# Patient Record
Sex: Female | Born: 1965 | Race: White | Hispanic: No | Marital: Single | Smoking: Never smoker
Health system: Southern US, Community
[De-identification: ages and names within clinical notes are randomized; demographics above are authoritative.]

## PROBLEM LIST (undated history)

## (undated) DIAGNOSIS — I890 Lymphedema, not elsewhere classified: Secondary | ICD-10-CM

## (undated) DIAGNOSIS — M1990 Unspecified osteoarthritis, unspecified site: Secondary | ICD-10-CM

## (undated) DIAGNOSIS — Z800 Family history of malignant neoplasm of digestive organs: Secondary | ICD-10-CM

## (undated) DIAGNOSIS — C50512 Malignant neoplasm of lower-outer quadrant of left female breast: Secondary | ICD-10-CM

## (undated) DIAGNOSIS — Z9889 Other specified postprocedural states: Secondary | ICD-10-CM

## (undated) DIAGNOSIS — I10 Essential (primary) hypertension: Secondary | ICD-10-CM

## (undated) DIAGNOSIS — R112 Nausea with vomiting, unspecified: Secondary | ICD-10-CM

## (undated) DIAGNOSIS — I739 Peripheral vascular disease, unspecified: Secondary | ICD-10-CM

## (undated) HISTORY — DX: Family history of malignant neoplasm of digestive organs: Z80.0

## (undated) HISTORY — PX: KNEE SURGERY: SHX244

## (undated) HISTORY — PX: CHOLECYSTECTOMY: SHX55

---
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
## 2001-02-02 ENCOUNTER — Encounter: Admission: RE | Admit: 2001-02-02 | Discharge: 2001-05-03 | Payer: Self-pay | Admitting: *Deleted

## 2004-11-06 ENCOUNTER — Ambulatory Visit: Payer: Self-pay | Admitting: Family Medicine

## 2005-05-28 ENCOUNTER — Ambulatory Visit: Payer: Self-pay | Admitting: Family Medicine

## 2005-08-06 ENCOUNTER — Other Ambulatory Visit: Admission: RE | Admit: 2005-08-06 | Discharge: 2005-08-06 | Payer: Self-pay | Admitting: Obstetrics and Gynecology

## 2005-09-19 ENCOUNTER — Ambulatory Visit (HOSPITAL_COMMUNITY): Admission: RE | Admit: 2005-09-19 | Discharge: 2005-09-19 | Payer: Self-pay | Admitting: Obstetrics and Gynecology

## 2005-12-22 ENCOUNTER — Ambulatory Visit: Payer: Self-pay | Admitting: Family Medicine

## 2006-01-21 ENCOUNTER — Ambulatory Visit: Payer: Self-pay | Admitting: Family Medicine

## 2012-07-22 ENCOUNTER — Emergency Department (HOSPITAL_BASED_OUTPATIENT_CLINIC_OR_DEPARTMENT_OTHER)
Admission: EM | Admit: 2012-07-22 | Discharge: 2012-07-22 | Disposition: A | Discharge status: Home / Self Care | Payer: BC Managed Care – PPO | Attending: Emergency Medicine | Admitting: Emergency Medicine

## 2012-07-22 ENCOUNTER — Encounter (HOSPITAL_BASED_OUTPATIENT_CLINIC_OR_DEPARTMENT_OTHER): Payer: Self-pay | Admitting: *Deleted

## 2012-07-22 DIAGNOSIS — N644 Mastodynia: Secondary | ICD-10-CM | POA: Insufficient documentation

## 2012-07-22 DIAGNOSIS — I10 Essential (primary) hypertension: Secondary | ICD-10-CM | POA: Insufficient documentation

## 2012-07-22 DIAGNOSIS — N63 Unspecified lump in unspecified breast: Secondary | ICD-10-CM

## 2012-07-22 HISTORY — DX: Lymphedema, not elsewhere classified: I89.0

## 2012-07-22 HISTORY — DX: Essential (primary) hypertension: I10

## 2012-07-22 NOTE — ED Provider Notes (Signed)
History     CSN: [id number]  Arrival date & time 07/22/12  1049   First MD Initiated Contact with Patient 07/22/12 1127      Chief Complaint  Patient presents with  . Breast Pain    (Consider location/radiation/quality/duration/timing/severity/associated sxs/prior treatment) HPI Comments: Patient presents with 6 week history of discomfort in the left breast.  She denies any injury or trauma.  No nipple discharge.  No redness.  No fevers or chills.    The history is provided by the patient.    Past Medical History  Diagnosis Date  . Lymph edema   . Hypertension     Past Surgical History  Procedure Date  . Cholecystectomy   . Knee surgery     No family history on file.  History  Substance Use Topics  . Smoking status: Never Smoker   . Smokeless tobacco: Not on file  . Alcohol Use: No    OB History    Grav Para Term Preterm Abortions TAB SAB Ect Mult Living                  Review of Systems  All other systems reviewed and are negative.    Allergies  Review of patient's allergies indicates no known allergies.  Home Medications   Current Outpatient Rx  Name Route Sig Dispense Refill  . LISINOPRIL PO Oral Take by mouth.      BP 157/77  Pulse 82  Temp 98.6 F (37 C) (Oral)  Resp 18  Ht 5\' 6"  (1.676 m)  Wt 340 lb (154.223 kg)  BMI 54.88 kg/m2  SpO2 100%  Physical Exam  Nursing note and vitals reviewed. Constitutional: She is oriented to person, place, and time. She appears well-developed and well-nourished.  HENT:  Head: Normocephalic and atraumatic.  Neck: Normal range of motion. Neck supple.  Neurological: She is alert and oriented to person, place, and time.  Skin: Skin is warm and dry.       Breast exam was performed which reveals a 2 cm mobile, firm area to the tissue inferior to the axilla and lateral to the left breast.  There is no nipple redness or discharge.      ED Course  Procedures (including critical care time)  Labs  Reviewed - No data to display No results found.   No diagnosis found.    MDM  I feel an area that may be concerning and warrants further workup.  She needs to have a mammogram and ultrasound of this area.  These are done at [facility] and the patient is new to town and has no primary.  I will order these exams and the patient is to call to schedule.  She also will need follow up.  She will be given the contact information for the Gyn physician on call today.          Geoffery Lyons, MD 07/22/12 1140

## 2012-07-22 NOTE — ED Notes (Signed)
Pain in her left breast for 6 weeks. Diagnosed with lymphedema 6 months ago after having lower right leg swelling. The breast pain started after that time.

## 2012-08-12 ENCOUNTER — Other Ambulatory Visit (HOSPITAL_COMMUNITY)
Admission: RE | Admit: 2012-08-12 | Discharge: 2012-08-12 | Disposition: A | Discharge status: Home / Self Care | Payer: BC Managed Care – PPO | Source: Ambulatory Visit | Attending: Obstetrics and Gynecology | Admitting: Obstetrics and Gynecology

## 2012-08-12 ENCOUNTER — Other Ambulatory Visit: Payer: Self-pay | Admitting: Obstetrics and Gynecology

## 2012-08-12 DIAGNOSIS — Z01419 Encounter for gynecological examination (general) (routine) without abnormal findings: Secondary | ICD-10-CM | POA: Insufficient documentation

## 2012-08-12 DIAGNOSIS — Z1151 Encounter for screening for human papillomavirus (HPV): Secondary | ICD-10-CM | POA: Insufficient documentation

## 2012-08-17 ENCOUNTER — Other Ambulatory Visit: Payer: Self-pay | Admitting: Obstetrics and Gynecology

## 2012-08-17 DIAGNOSIS — N63 Unspecified lump in unspecified breast: Secondary | ICD-10-CM

## 2012-08-20 ENCOUNTER — Ambulatory Visit
Admission: RE | Admit: 2012-08-20 | Discharge: 2012-08-20 | Disposition: A | Discharge status: Home / Self Care | Payer: BC Managed Care – PPO | Source: Ambulatory Visit | Attending: Obstetrics and Gynecology | Admitting: Obstetrics and Gynecology

## 2012-08-20 DIAGNOSIS — N63 Unspecified lump in unspecified breast: Secondary | ICD-10-CM

## 2012-08-25 ENCOUNTER — Other Ambulatory Visit: Payer: Self-pay | Admitting: Obstetrics and Gynecology

## 2012-08-25 DIAGNOSIS — N920 Excessive and frequent menstruation with regular cycle: Secondary | ICD-10-CM

## 2012-08-30 ENCOUNTER — Ambulatory Visit
Admission: RE | Admit: 2012-08-30 | Discharge: 2012-08-30 | Disposition: A | Discharge status: Home / Self Care | Payer: BC Managed Care – PPO | Source: Ambulatory Visit | Attending: Obstetrics and Gynecology | Admitting: Obstetrics and Gynecology

## 2012-08-30 DIAGNOSIS — N920 Excessive and frequent menstruation with regular cycle: Secondary | ICD-10-CM

## 2012-09-22 ENCOUNTER — Other Ambulatory Visit: Payer: Self-pay | Admitting: Gastroenterology

## 2012-09-22 DIAGNOSIS — R1011 Right upper quadrant pain: Secondary | ICD-10-CM

## 2012-09-22 DIAGNOSIS — R1012 Left upper quadrant pain: Secondary | ICD-10-CM

## 2012-09-24 ENCOUNTER — Ambulatory Visit
Admission: RE | Admit: 2012-09-24 | Discharge: 2012-09-24 | Disposition: A | Discharge status: Home / Self Care | Payer: BC Managed Care – PPO | Source: Ambulatory Visit | Attending: Gastroenterology | Admitting: Gastroenterology

## 2012-09-24 DIAGNOSIS — R1011 Right upper quadrant pain: Secondary | ICD-10-CM

## 2012-09-24 DIAGNOSIS — R1012 Left upper quadrant pain: Secondary | ICD-10-CM

## 2012-09-24 MED ORDER — IOHEXOL 300 MG/ML  SOLN
125.0000 mL | Freq: Once | INTRAMUSCULAR | Status: AC | PRN
  Administered 2012-09-24: 125 mL via INTRAVENOUS

## 2012-10-02 ENCOUNTER — Encounter (HOSPITAL_COMMUNITY): Payer: Self-pay | Admitting: Pharmacist

## 2012-10-11 ENCOUNTER — Other Ambulatory Visit: Payer: Self-pay

## 2012-10-11 ENCOUNTER — Encounter (HOSPITAL_COMMUNITY)
Admission: RE | Admit: 2012-10-11 | Discharge: 2012-10-11 | Disposition: A | Discharge status: Home / Self Care | Payer: BC Managed Care – PPO | Source: Ambulatory Visit | Attending: Obstetrics and Gynecology | Admitting: Obstetrics and Gynecology

## 2012-10-11 ENCOUNTER — Encounter (HOSPITAL_COMMUNITY): Payer: Self-pay

## 2012-10-11 ENCOUNTER — Encounter (HOSPITAL_COMMUNITY): Payer: Self-pay | Admitting: Pharmacy Technician

## 2012-10-11 HISTORY — DX: Peripheral vascular disease, unspecified: I73.9

## 2012-10-11 HISTORY — DX: Unspecified osteoarthritis, unspecified site: M19.90

## 2012-10-11 LAB — BASIC METABOLIC PANEL
CO2: 27 mEq/L (ref 19–32)
Calcium: 9.1 mg/dL (ref 8.4–10.5)
Chloride: 98 mEq/L (ref 96–112)
Creatinine, Ser: 0.66 mg/dL (ref 0.50–1.10)
GFR calc Af Amer: >90 mL/min (ref >90)
GFR calc non Af Amer: >90 mL/min (ref >90)
Glucose, Bld: 97 mg/dL (ref 70–99)
Sodium: 134 mEq/L — ABNORMAL LOW (ref 135–145)

## 2012-10-11 LAB — CBC
HCT: 36.2 % (ref 36.0–46.0)
MCHC: 31.5 g/dL (ref 30.0–36.0)
MCV: 97.1 fL (ref 78.0–100.0)
Platelets: 296 10*3/uL (ref 150–400)
RBC: 3.73 MIL/uL — ABNORMAL LOW (ref 3.87–5.11)
RDW: 14.4 % (ref 11.5–15.5)
WBC: 7.9 10*3/uL (ref 4.0–10.5)

## 2012-10-11 NOTE — Patient Instructions (Addendum)
   Your procedure is scheduled on: Thursday, Nov 21 at 1100am  Enter through the Main Entrance of [facility] at: 930am Pick up the phone at the desk and dial [phone] and inform us of your arrival.  Please call this number if you have any problems the morning of surgery: [phone]  Remember: Do not eat food after midnight:  Wednesday Do not drink clear liquids after: midnight Wednesday Take these medicines the morning of surgery with a SIP OF WATER: None - BP med taken at night  Do not wear jewelry, make-up, or FINGER nail polish No metal in your hair or on your body. Do not wear lotions, powders, perfumes. You may wear deodorant.  Please use your CHG wash as directed prior to surgery.  Do not shave anywhere for at least 12 hours prior to first CHG shower.  Do not bring valuables to the hospital. Contacts, dentures or bridgework may not be worn into surgery.  Patients discharged on the day of surgery will not be allowed to drive home.   Home with [name] Castetter - daughter.

## 2012-10-14 ENCOUNTER — Ambulatory Visit (HOSPITAL_COMMUNITY)
Admission: RE | Admit: 2012-10-14 | Discharge: 2012-10-14 | Disposition: A | Discharge status: Home / Self Care | Payer: BC Managed Care – PPO | Source: Ambulatory Visit | Attending: Obstetrics and Gynecology | Admitting: Obstetrics and Gynecology

## 2012-10-14 ENCOUNTER — Encounter (HOSPITAL_COMMUNITY): Payer: Self-pay | Admitting: Registered Nurse

## 2012-10-14 ENCOUNTER — Ambulatory Visit (HOSPITAL_COMMUNITY): Payer: BC Managed Care – PPO | Admitting: Registered Nurse

## 2012-10-14 ENCOUNTER — Encounter (HOSPITAL_COMMUNITY)
Admission: RE | Disposition: A | Discharge status: Home / Self Care | Payer: Self-pay | Source: Ambulatory Visit | Attending: Obstetrics and Gynecology

## 2012-10-14 ENCOUNTER — Ambulatory Visit (HOSPITAL_COMMUNITY): Payer: BC Managed Care – PPO

## 2012-10-14 DIAGNOSIS — N921 Excessive and frequent menstruation with irregular cycle: Secondary | ICD-10-CM | POA: Diagnosis present

## 2012-10-14 DIAGNOSIS — N949 Unspecified condition associated with female genital organs and menstrual cycle: Secondary | ICD-10-CM | POA: Insufficient documentation

## 2012-10-14 DIAGNOSIS — Z01812 Encounter for preprocedural laboratory examination: Secondary | ICD-10-CM | POA: Insufficient documentation

## 2012-10-14 DIAGNOSIS — N938 Other specified abnormal uterine and vaginal bleeding: Secondary | ICD-10-CM | POA: Insufficient documentation

## 2012-10-14 DIAGNOSIS — Z01818 Encounter for other preprocedural examination: Secondary | ICD-10-CM | POA: Insufficient documentation

## 2012-10-14 DIAGNOSIS — N920 Excessive and frequent menstruation with regular cycle: Secondary | ICD-10-CM | POA: Insufficient documentation

## 2012-10-14 DIAGNOSIS — Z9889 Other specified postprocedural states: Secondary | ICD-10-CM

## 2012-10-14 HISTORY — PX: HYSTEROSCOPY WITH D & C: SHX1775

## 2012-10-14 SURGERY — DILATATION AND CURETTAGE /HYSTEROSCOPY
Anesthesia: General | Site: Uterus | Wound class: Clean Contaminated

## 2012-10-14 MED ORDER — PROPOFOL 10 MG/ML IV EMUL
INTRAVENOUS | Status: AC
  Filled 2012-10-14: qty 20

## 2012-10-14 MED ORDER — LIDOCAINE-EPINEPHRINE (PF) 2 %-1:200000 IJ SOLN
INTRAMUSCULAR | Status: AC
  Filled 2012-10-14: qty 20

## 2012-10-14 MED ORDER — HYDROCODONE-ACETAMINOPHEN 5-325 MG PO TABS
1.0000 | ORAL_TABLET | Freq: Once | ORAL | Status: AC
  Administered 2012-10-14: 1 via ORAL

## 2012-10-14 MED ORDER — ONDANSETRON HCL 4 MG/2ML IJ SOLN
INTRAMUSCULAR | Status: DC | PRN
  Administered 2012-10-14: 4 mg via INTRAVENOUS

## 2012-10-14 MED ORDER — MEPERIDINE HCL 25 MG/ML IJ SOLN
INTRAMUSCULAR | Status: AC
  Administered 2012-10-14: 12.5 mg via INTRAVENOUS
  Filled 2012-10-14: qty 1

## 2012-10-14 MED ORDER — EPHEDRINE SULFATE 50 MG/ML IJ SOLN
INTRAMUSCULAR | Status: DC | PRN
  Administered 2012-10-14: 10 mg via INTRAVENOUS

## 2012-10-14 MED ORDER — DEXTROSE 5 % IV SOLN
3.0000 g | INTRAVENOUS | Status: AC
  Administered 2012-10-14: 3 g via INTRAVENOUS
  Filled 2012-10-14: qty 3000

## 2012-10-14 MED ORDER — DEXAMETHASONE SODIUM PHOSPHATE 4 MG/ML IJ SOLN: 8.0000 mg | Freq: Once | INTRAMUSCULAR | Status: DC | PRN

## 2012-10-14 MED ORDER — HYDROCODONE-ACETAMINOPHEN 5-325 MG PO TABS
ORAL_TABLET | ORAL | Status: AC
  Administered 2012-10-14: 1 via ORAL
  Filled 2012-10-14: qty 1

## 2012-10-14 MED ORDER — MIDAZOLAM HCL 5 MG/5ML IJ SOLN
INTRAMUSCULAR | Status: DC | PRN
  Administered 2012-10-14: 2 mg via INTRAVENOUS

## 2012-10-14 MED ORDER — FENTANYL CITRATE 0.05 MG/ML IJ SOLN
INTRAMUSCULAR | Status: AC
  Administered 2012-10-14: 50 ug via INTRAVENOUS
  Filled 2012-10-14: qty 2

## 2012-10-14 MED ORDER — SODIUM CHLORIDE 0.9 % IR SOLN
Status: DC | PRN
  Administered 2012-10-14: 3000 mL

## 2012-10-14 MED ORDER — MIDAZOLAM HCL 2 MG/2ML IJ SOLN
INTRAMUSCULAR | Status: AC
  Filled 2012-10-14: qty 2

## 2012-10-14 MED ORDER — FENTANYL CITRATE 0.05 MG/ML IJ SOLN
25.0000 ug | INTRAMUSCULAR | Status: DC | PRN
  Administered 2012-10-14 (×3): 50 ug via INTRAVENOUS

## 2012-10-14 MED ORDER — FENTANYL CITRATE 0.05 MG/ML IJ SOLN
INTRAMUSCULAR | Status: AC
  Filled 2012-10-14: qty 2

## 2012-10-14 MED ORDER — DEXAMETHASONE SODIUM PHOSPHATE 10 MG/ML IJ SOLN
INTRAMUSCULAR | Status: AC
  Filled 2012-10-14: qty 1

## 2012-10-14 MED ORDER — FENTANYL CITRATE 0.05 MG/ML IJ SOLN
INTRAMUSCULAR | Status: DC | PRN
  Administered 2012-10-14 (×2): 50 ug via INTRAVENOUS

## 2012-10-14 MED ORDER — LACTATED RINGERS IV SOLN
INTRAVENOUS | Status: DC
  Administered 2012-10-14: 10:00:00 via INTRAVENOUS

## 2012-10-14 MED ORDER — KETOROLAC TROMETHAMINE 30 MG/ML IJ SOLN: 15.0000 mg | Freq: Once | INTRAMUSCULAR | Status: DC | PRN

## 2012-10-14 MED ORDER — PROPOFOL 10 MG/ML IV BOLUS
INTRAVENOUS | Status: DC | PRN
  Administered 2012-10-14: 200 mg via INTRAVENOUS

## 2012-10-14 MED ORDER — EPHEDRINE 5 MG/ML INJ
INTRAVENOUS | Status: AC
  Filled 2012-10-14: qty 10

## 2012-10-14 MED ORDER — LIDOCAINE HCL 2 % IJ SOLN
INTRAMUSCULAR | Status: DC | PRN
  Administered 2012-10-14: 2 mL

## 2012-10-14 MED ORDER — MEPERIDINE HCL 25 MG/ML IJ SOLN
6.2500 mg | INTRAMUSCULAR | Status: DC | PRN
  Administered 2012-10-14: 12.5 mg via INTRAVENOUS

## 2012-10-14 MED ORDER — LIDOCAINE HCL (CARDIAC) 20 MG/ML IV SOLN
INTRAVENOUS | Status: AC
  Filled 2012-10-14: qty 5

## 2012-10-14 MED ORDER — LIDOCAINE HCL (CARDIAC) 20 MG/ML IV SOLN
INTRAVENOUS | Status: DC | PRN
  Administered 2012-10-14: 100 mg via INTRAVENOUS

## 2012-10-14 MED ORDER — ONDANSETRON HCL 4 MG/2ML IJ SOLN
INTRAMUSCULAR | Status: AC
  Filled 2012-10-14: qty 2

## 2012-10-14 MED ORDER — DEXAMETHASONE SODIUM PHOSPHATE 10 MG/ML IJ SOLN
INTRAMUSCULAR | Status: DC | PRN
  Administered 2012-10-14: 10 mg via INTRAVENOUS

## 2012-10-14 SURGICAL SUPPLY — 16 items
CANISTER SUCTION 2500CC (MISCELLANEOUS) ×2 IMPLANT
CATH ROBINSON RED A/P 16FR (CATHETERS) ×2 IMPLANT
CLOTH BEACON ORANGE TIMEOUT ST (SAFETY) ×2 IMPLANT
CONTAINER PREFILL 10% NBF 60ML (FORM) ×4 IMPLANT
DILATOR CANAL MILEX (MISCELLANEOUS) ×1 IMPLANT
DRESSING TELFA 8X3 (GAUZE/BANDAGES/DRESSINGS) ×2 IMPLANT
GLOVE BIO SURGEON STRL SZ7 (GLOVE) ×2 IMPLANT
GLOVE BIOGEL PI IND STRL 7.0 (GLOVE) ×2 IMPLANT
GLOVE BIOGEL PI INDICATOR 7.0 (GLOVE) ×5
GLOVE NEODERM STER SZ 7 (GLOVE) ×3 IMPLANT
GLOVE SURG SS PI 7.0 STRL IVOR (GLOVE) ×2 IMPLANT
GOWN STRL REIN XL XLG (GOWN DISPOSABLE) ×6 IMPLANT
PACK HYSTEROSCOPY LF (CUSTOM PROCEDURE TRAY) ×2 IMPLANT
PAD OB MATERNITY 4.3X12.25 (PERSONAL CARE ITEMS) ×2 IMPLANT
TOWEL OR 17X24 6PK STRL BLUE (TOWEL DISPOSABLE) ×4 IMPLANT
WATER STERILE IRR 1000ML POUR (IV SOLUTION) ×2 IMPLANT

## 2012-10-14 NOTE — Transfer of Care (Signed)
Immediate Anesthesia Transfer of Care Note  Patient: Teresa Hayes  Procedure(s) Performed: Procedure(s) (LRB) with comments: DILATATION AND CURETTAGE /HYSTEROSCOPY (N/A) - wants ultrasound guidance  ask scheduler to remember to tell surgeon to put order in  Patient Location: PACU  Anesthesia Type:General  Level of Consciousness: awake, alert  and oriented  Airway & Oxygen Therapy: Patient Spontanous Breathing and Patient connected to nasal cannula oxygen  Post-op Assessment: Report given to PACU RN  Post vital signs: Reviewed  Complications: No apparent anesthesia complications

## 2012-10-14 NOTE — Anesthesia Postprocedure Evaluation (Signed)
Anesthesia Post Note  Patient: Teresa Hayes  Procedure(s) Performed: Procedure(s) (LRB): DILATATION AND CURETTAGE /HYSTEROSCOPY (N/A)  Anesthesia type: General  Patient location: PACU  Post pain: Pain level controlled  Post assessment: Post-op Vital signs reviewed  Last Vitals:  Filed Vitals:   10/14/12 0937  BP: 114/79  Pulse:   Temp:   Resp:     Post vital signs: Reviewed  Level of consciousness: sedated  Complications: No apparent anesthesia complicationsfj

## 2012-10-14 NOTE — Preoperative (Signed)
Beta Blockers   Reason not to administer Beta Blockers:Not Applicable 

## 2012-10-14 NOTE — Anesthesia Preprocedure Evaluation (Signed)
Anesthesia Evaluation  Patient identified by MRN, date of birth, ID band Patient awake    Reviewed: Allergy & Precautions, H&P , NPO status , Patient's Chart, lab work & pertinent test results  Airway Mallampati: III TM Distance: >3 FB Neck ROM: full    Dental No notable dental hx. (+) Teeth Intact   Pulmonary neg pulmonary ROS,    Pulmonary exam normal       Cardiovascular hypertension, Pt. on medications     Neuro/Psych negative neurological ROS  negative psych ROS   GI/Hepatic negative GI ROS, Neg liver ROS,   Endo/Other  Morbid obesity  Renal/GU negative Renal ROS  negative genitourinary   Musculoskeletal negative musculoskeletal ROS (+)   Abdominal (+) + obese,   Peds negative pediatric ROS (+)  Hematology negative hematology ROS (+)   Anesthesia Other Findings   Reproductive/Obstetrics negative OB ROS                           Anesthesia Physical Anesthesia Plan  ASA: III  Anesthesia Plan: General   Post-op Pain Management:    Induction: Intravenous  Airway Management Planned: LMA  Additional Equipment:   Intra-op Plan:   Post-operative Plan:   Informed Consent: I have reviewed the patients History and Physical, chart, labs and discussed the procedure including the risks, benefits and alternatives for the proposed anesthesia with the patient or authorized representative who has indicated his/her understanding and acceptance.     Plan Discussed with: CRNA and Surgeon  Anesthesia Plan Comments:         Anesthesia Quick Evaluation

## 2012-10-14 NOTE — Brief Op Note (Signed)
10/14/2012  1:31 PM  PATIENT:  Teresa Hayes  46 y.o. female  PRE-OPERATIVE DIAGNOSIS:  Menometrorrhagia  POST-OPERATIVE DIAGNOSIS:  Menometrorrhagia  PROCEDURE:  Procedure(s) (LRB) with comments: DILATATION AND CURETTAGE /HYSTEROSCOPY (N/A) - wants ultrasound guidance  ask scheduler to remember to tell surgeon to put order in  SURGEON:  Surgeon(s) and Role:    * Geryl Rankins, MD - Primary  PHYSICIAN ASSISTANT:   ASSISTANTS: none   ANESTHESIA:   general  EBL:  Total I/O In: 800 [I.V.:800] Out: 60 [Urine:50; Blood:10]  BLOOD ADMINISTERED:none  DRAINS: none   LOCAL MEDICATIONS USED:  LIDOCAINE 2 %, less than 3 cc.  SPECIMEN:  Source of Specimen:  Endometrial currettings  DISPOSITION OF SPECIMEN:  PATHOLOGY  COUNTS:  YES  TOURNIQUET:  * No tourniquets in log *  DICTATION: .Other Dictation: Dictation Number [id number]  PLAN OF CARE: Discharge to home after PACU  PATIENT DISPOSITION:  PACU - hemodynamically stable.   Delay start of Pharmacological VTE agent (>24hrs) due to surgical blood loss or risk of bleeding: not applicable

## 2012-10-14 NOTE — H&P (Signed)
10/04/2012  History of Present Illness  General:  46 y/o G2P2 with menometrorrhagia and morbid obesity presents for preop for hysteroscopy D&C. Pt failed office EMB twice. Second attempt in office pt was pretreated with Cytotec and cervix was still stenotic. Stopped bleeding Oct 28th. Pt was on Provera 5mg  and increased to 10 mg and bleeding. No SOB, Chest pain, N/V. Seen by [facility] Sprang gastro for peptic ulcer disease. Had a CT scan that was normal. Symptoms resolved with Zantac. Pt request GC/Chl.   Current Medications  Lisinopril-Hydrochlorothiazide 20-12.5 MG Tablet 1 tablet Once a day  Medication List reviewed and reconciled with the patient   Past Medical History  Hypertension  Abdominal pain  Abnormal bleeding (cycles)  Vertigo   Surgical History  gall bladder removed   right knee surgery    Family History  Father: deceased Hypertension, obesity, Diabetes, passed away about a month ago of MI   Mother: alive   denies any GYN family cancer hx Neg GI family hx.   Social History  General:  History of smoking  cigarettes: Never smoked no Smoking.  no Alcohol.  no Recreational drug use.  Exercise: nothing structured.  Occupation: Office manager.  Marital Status: single, committed relationship.  Children: Boys, 1, girls, 1.    Gyn History  Sexual activity currently sexually active.  Periods : irregular.  LMP 08/26/12.  Birth control partner vasectomy.  Last pap smear date 08/12/12, ASCUS.  Last mammogram date 2009.  Denies H/O Abnormal pap smear .  Denies H/O STD .    OB History  Number of pregnancies 2.  Pregnancy # 1 live birth, vaginal delivery.  Pregnancy # 2 live birth, vaginal delivery.    Allergies  N.K.D.A.   Hospitalization/Major Diagnostic Procedure  childbirth x 2   Not in the past yr 08/2012   Review of Systems  See HPI.   Vital Signs  Wt 345, Ht 67, BMI 54.03, Pulse sitting 80, BP sitting 142/80.   Physical Examination  GENERAL:  Patient appears in  NAD, pleasant.  Build: well developed.  General Appearance: well-appearing, morbidly obese.  Race: caucasian.  NECK:  ROM: normal.  Thyroid: no thyromegaly, non tender.  LUNGS:  Breath sounds: clear to auscultation.  Dyspnea: no.  HEART:  Murmurs: none.  Rate: normal.  Rhythm: regular.  ABDOMEN:  General: no masses,tenderness,organomegaly, , BS normal, non distended.  FEMALE GENITOURINARY:  General deferred.  EXTREMITIES:  Extremities significant lymphedema.  NEUROLOGICAL:  gross motor and sensory grossly intact.  Orientation: alert and oriented x 3.     Assessments   1. Pre-op exam - V72.84 (Primary)   2. DUB (dysfunctional uterine bleeding) - 626.8, High risk for endometrial cancer due to morbid obesity. Not definatively perimenopausal, suspect bleeding is due to unopposed estrogen.   3. Screen for sexually transmitted diseases - V74.5   Treatment  1. DUB (dysfunctional uterine bleeding)  Recommend Hyst/D&C to rule out endometrial hyperplasia or endometrial cancer. Will have ultrasound available during the procedure b/c pt is quite stenotic. Pretreat again with Cytotec. Pt counseled on the R/B/A of the procedure. Understands risk of possible uterine injury, especially with stenosis. Will abort if procedure can not be safely completed. Hysterectomy will need to be performed.    2. Screen for sexually transmitted diseases  LAB: NuSwab Chlamydia/Gonorrhea Amplification ([id number]) Negative   Chlamydia trachomatis, NAA NEGATIVE NEGATIVE -   Neisseria gonorrhoeae, NAA NEGATIVE NEGATIVE -   Please note: COMMENT -    Ariana Juul B 10/12/2012 09:11:38 AM >  Neg GC/Chl. Allman,Michelle 10/13/2012 10:52:39 AM > Left pt a detailed msg on cell phone.   Procedure Codes  09811 BLOOD COLLECTION ROUTINE VENIPUNCTURE   Follow Up  2 Weeks post op

## 2012-10-14 NOTE — Interval H&P Note (Signed)
History and Physical Interval Note:  10/14/2012 11:24 AM  Teresa Hayes  has presented today for surgery, with the diagnosis of Menometrorrhagia  The various methods of treatment have been discussed with the patient and family. After consideration of risks, benefits and other options for treatment, the patient has consented to  Procedure(s) (LRB) with comments: DILATATION AND CURETTAGE /HYSTEROSCOPY (N/A) - wants ultrasound guidance  ask scheduler to remember to tell surgeon to put order in as a surgical intervention .  The patient's history has been reviewed, patient examined, no change in status, stable for surgery.  I have reviewed the patient's chart and labs.  Questions were answered to the patient's satisfaction.    Placed Cytotec.  Cramping overnight.  No bleeding since Preop.  Dion Body, [name]

## 2012-10-15 ENCOUNTER — Encounter (HOSPITAL_COMMUNITY): Payer: Self-pay | Admitting: Obstetrics and Gynecology

## 2012-10-15 NOTE — Op Note (Signed)
NAMEMEAGHANN, CHOO NO.:  [id number]  MEDICAL RECORD NO.:  [id number]  LOCATION:  [facility]                          FACILITY:  [facility]  PHYSICIAN:  Teresa Partridge, MD   DATE OF BIRTH:  July 03, 1966  DATE OF PROCEDURE:  10/14/2012 DATE OF DISCHARGE:  10/14/2012                              OPERATIVE REPORT   PREOPERATIVE DIAGNOSES:  Menometrorrhagia, dysfunctional uterine bleeding, morbid obesity.  PROCEDURE:  Hysteroscopy, dilation and curettage with ultrasound guidance.  SURGEON:  Teresa Partridge, MD.  ASSISTANTS:  None.  EBL:  10.  URINE OUT:  50.  In and out cath prior to procedure.  ANESTHESIA:  Local lidocaine 2%, less than 3 mL.  SPECIMEN:  Endometrial curettings to path.  COMPLICATIONS:  None.  DISPOSITION:  To PACU hemodynamically stable.  FINDINGS:  Normal endometrial cavity, sharply anteverted.  No masses are appreciated concerning for cancer.  INDICATIONS:  Teresa Hayes is a 46 year old, G2, P2, morbidly obese, has a history of sudden onset of active bleeding which she was treated with several courses of Provera.  I attempted to do an endometrial biopsy but was unable to complete it initially the first time because of stenosis and patient discomfort.  The patient was treated with Cytotec and still cervix was stenotic, so she was counseled on hysteroscopy and D and C under anesthesia.  Ultrasound guidance was ordered in case she has significant stenosis and did not want to have any uterine perforation as I did not want to do any emergency procedures given the patient's habitus.  She was pretreated with Cytotec the night before.  PROCEDURE IN DETAIL:  The patient was identified in the holding area. She was then transferred to the operating room, where she underwent spinal anesthesia without complication.  She was then placed in a dorsal lithotomy position and prepped and draped in a normal sterile fashion. I and O cath was performed.  A  Graves speculum was inserted into the vagina.  It was a regular length Graves but the labia did concave around, so I could see the cervix well but the labia somewhat blocked the lighting.  The anterior lip of the cervix was identified.  The anterior lip of the cervix was injected with approximately 2 mL of 2% lidocaine.  The anterior lip of the cervix was then grasped with a single-tooth tenaculum.  The os finder was used and it dilated much easier at this time.  She is dilated up to a 5 Hegar. She was more anteverted than expected, so ultrasound guidance was used, and the hysteroscope was done, and the findings above were noted.  Once the hysteroscope was removed, a sharp curettage of all quadrants was performed.  There were some areas of thickened endometrium that was mostly on the posterior side, so I did do the curettage in all 4 quadrants.  Then, I looked one more time just to make sure those areas had been adequately sampled and I did 1 more curettage after the second look and got adequate tissue.  The patient desires a vaginal hysterectomy, and due to her habitus I wanted to see if that will be easy to accomplish.  We would need  long instruments, but she did have good prolapse when the anterior lip of the cervix was grasped with a single-tooth and ring forceps.  The patient tolerated the procedure well.  There was hemostasis after the procedure was performed.  All instrument, sponge, and needle counts were correct x3.  She got Ancef 3 g IV prior to the procedure.  She was awakened in recovery room and taken to the PACU in stable condition.     Teresa Partridge, MD     EBV/MEDQ  D:  10/14/2012  T:  10/15/2012  Job:  [id number]

## 2012-11-11 ENCOUNTER — Other Ambulatory Visit: Payer: Self-pay | Admitting: Obstetrics and Gynecology

## 2012-12-15 ENCOUNTER — Inpatient Hospital Stay: Admit: 2012-12-15 | Payer: Self-pay | Admitting: Obstetrics and Gynecology

## 2012-12-15 SURGERY — HYSTERECTOMY, VAGINAL
Anesthesia: Choice

## 2013-01-19 ENCOUNTER — Ambulatory Visit: Admit: 2013-01-19 | Payer: Self-pay | Admitting: Obstetrics and Gynecology

## 2013-01-19 SURGERY — HYSTERECTOMY, VAGINAL
Anesthesia: Choice

## 2013-09-29 ENCOUNTER — Other Ambulatory Visit: Payer: Self-pay

## 2013-09-29 DIAGNOSIS — Z1231 Encounter for screening mammogram for malignant neoplasm of breast: Secondary | ICD-10-CM

## 2013-11-03 ENCOUNTER — Ambulatory Visit
Admission: RE | Admit: 2013-11-03 | Discharge: 2013-11-03 | Disposition: A | Discharge status: Home / Self Care | Payer: BC Managed Care – PPO | Source: Ambulatory Visit

## 2013-11-03 DIAGNOSIS — Z1231 Encounter for screening mammogram for malignant neoplasm of breast: Secondary | ICD-10-CM

## 2013-12-22 ENCOUNTER — Other Ambulatory Visit (HOSPITAL_COMMUNITY)
Admission: RE | Admit: 2013-12-22 | Discharge: 2013-12-22 | Disposition: A | Discharge status: Home / Self Care | Payer: BC Managed Care – PPO | Source: Ambulatory Visit | Attending: Obstetrics and Gynecology | Admitting: Obstetrics and Gynecology

## 2013-12-22 ENCOUNTER — Other Ambulatory Visit: Payer: Self-pay | Admitting: Obstetrics and Gynecology

## 2013-12-22 DIAGNOSIS — Z01419 Encounter for gynecological examination (general) (routine) without abnormal findings: Secondary | ICD-10-CM | POA: Insufficient documentation

## 2013-12-28 ENCOUNTER — Other Ambulatory Visit: Payer: Self-pay | Admitting: Obstetrics and Gynecology

## 2013-12-28 DIAGNOSIS — N926 Irregular menstruation, unspecified: Secondary | ICD-10-CM

## 2013-12-28 DIAGNOSIS — N912 Amenorrhea, unspecified: Secondary | ICD-10-CM

## 2013-12-30 ENCOUNTER — Ambulatory Visit
Admission: RE | Admit: 2013-12-30 | Discharge: 2013-12-30 | Disposition: A | Discharge status: Home / Self Care | Payer: BC Managed Care – PPO | Source: Ambulatory Visit | Attending: Obstetrics and Gynecology | Admitting: Obstetrics and Gynecology

## 2013-12-30 DIAGNOSIS — N926 Irregular menstruation, unspecified: Secondary | ICD-10-CM

## 2013-12-30 DIAGNOSIS — N912 Amenorrhea, unspecified: Secondary | ICD-10-CM

## 2015-02-07 ENCOUNTER — Encounter: Payer: Self-pay | Admitting: Podiatry

## 2015-02-07 ENCOUNTER — Ambulatory Visit (INDEPENDENT_AMBULATORY_CARE_PROVIDER_SITE_OTHER): Payer: BLUE CROSS/BLUE SHIELD | Admitting: Podiatry

## 2015-02-07 VITALS — Ht 66.0 in | Wt 340.0 lb

## 2015-02-07 DIAGNOSIS — M7732 Calcaneal spur, left foot: Secondary | ICD-10-CM | POA: Diagnosis not present

## 2015-02-07 DIAGNOSIS — M79672 Pain in left foot: Secondary | ICD-10-CM | POA: Diagnosis not present

## 2015-02-07 DIAGNOSIS — M216X2 Other acquired deformities of left foot: Secondary | ICD-10-CM

## 2015-02-07 DIAGNOSIS — M7662 Achilles tendinitis, left leg: Secondary | ICD-10-CM | POA: Diagnosis not present

## 2015-02-07 DIAGNOSIS — M216X9 Other acquired deformities of unspecified foot: Secondary | ICD-10-CM | POA: Diagnosis not present

## 2015-02-07 MED ORDER — HYDROCODONE-IBUPROFEN 7.5-200 MG PO TABS
1.0000 | ORAL_TABLET | Freq: Three times a day (TID) | ORAL | Status: DC | PRN
Start: 2015-02-07 — End: 2019-09-12

## 2015-02-07 MED ORDER — NABUMETONE 500 MG PO TABS
500.0000 mg | ORAL_TABLET | Freq: Two times a day (BID) | ORAL | Status: DC
Start: 2015-02-07 — End: 2019-09-12

## 2015-02-07 NOTE — Progress Notes (Signed)
Subjective: 49 year old female presents complaining of left foot pain, back of the heel x 5 months. This is the first episode. Wears well cushioned work shoes but today wearing flat canvas shoe.  The area is very painful requesting X-rays done.   Objective:  Lymph edema bilateral lower limbs, severe. No redness or soft tissue swelling on left posterior heel affected area. Radiographic examination reveal large posterior and inferior calcaneal spur L>R.  Assessment: Achilles tendonitis left. Ankle equinus bilateral. Pain in left heel.  Plan: Reviewed clinical findings and available treatment options.  Night splint dispensed. Need exercise and custom orthotics.

## 2015-02-07 NOTE — Patient Instructions (Signed)
Seen for left heel pain. Night Splint dispensed. Continue to do stretch exercise.  Need custom orthotics. Will call patient with insurance info.

## 2015-02-23 ENCOUNTER — Ambulatory Visit (INDEPENDENT_AMBULATORY_CARE_PROVIDER_SITE_OTHER): Payer: BLUE CROSS/BLUE SHIELD | Admitting: Podiatry

## 2015-02-23 ENCOUNTER — Encounter: Payer: Self-pay | Admitting: Podiatry

## 2015-02-23 DIAGNOSIS — M7662 Achilles tendinitis, left leg: Secondary | ICD-10-CM

## 2015-02-23 NOTE — Patient Instructions (Signed)
Return to work letter dispensed.

## 2015-02-23 NOTE — Progress Notes (Signed)
Patient presents stating the last medication is working well. Wants to be back to work without limitation. Return to work letter dispensed.  Return as needed.

## 2015-03-06 ENCOUNTER — Other Ambulatory Visit: Payer: Self-pay | Admitting: Sports Medicine

## 2015-03-06 DIAGNOSIS — M25561 Pain in right knee: Secondary | ICD-10-CM

## 2015-03-18 ENCOUNTER — Other Ambulatory Visit: Payer: Self-pay

## 2015-04-02 ENCOUNTER — Ambulatory Visit
Admission: RE | Admit: 2015-04-02 | Discharge: 2015-04-02 | Disposition: A | Discharge status: Home / Self Care | Payer: BLUE CROSS/BLUE SHIELD | Source: Ambulatory Visit | Attending: Sports Medicine | Admitting: Sports Medicine

## 2015-04-02 DIAGNOSIS — M25561 Pain in right knee: Secondary | ICD-10-CM

## 2015-04-19 ENCOUNTER — Other Ambulatory Visit: Payer: Self-pay | Admitting: Obstetrics and Gynecology

## 2015-04-19 DIAGNOSIS — N970 Female infertility associated with anovulation: Secondary | ICD-10-CM

## 2015-04-24 ENCOUNTER — Other Ambulatory Visit: Payer: BLUE CROSS/BLUE SHIELD

## 2015-04-27 ENCOUNTER — Ambulatory Visit
Admission: RE | Admit: 2015-04-27 | Discharge: 2015-04-27 | Disposition: A | Discharge status: Home / Self Care | Payer: BLUE CROSS/BLUE SHIELD | Source: Ambulatory Visit | Attending: Obstetrics and Gynecology | Admitting: Obstetrics and Gynecology

## 2015-04-27 DIAGNOSIS — N970 Female infertility associated with anovulation: Secondary | ICD-10-CM

## 2015-06-02 ENCOUNTER — Emergency Department (HOSPITAL_BASED_OUTPATIENT_CLINIC_OR_DEPARTMENT_OTHER): Payer: BLUE CROSS/BLUE SHIELD

## 2015-06-02 ENCOUNTER — Encounter (HOSPITAL_BASED_OUTPATIENT_CLINIC_OR_DEPARTMENT_OTHER): Payer: Self-pay | Admitting: Emergency Medicine

## 2015-06-02 ENCOUNTER — Emergency Department (HOSPITAL_BASED_OUTPATIENT_CLINIC_OR_DEPARTMENT_OTHER)
Admission: EM | Admit: 2015-06-02 | Discharge: 2015-06-02 | Disposition: A | Discharge status: Home / Self Care | Payer: BLUE CROSS/BLUE SHIELD | Attending: Emergency Medicine | Admitting: Emergency Medicine

## 2015-06-02 DIAGNOSIS — R112 Nausea with vomiting, unspecified: Secondary | ICD-10-CM | POA: Diagnosis present

## 2015-06-02 DIAGNOSIS — K529 Noninfective gastroenteritis and colitis, unspecified: Secondary | ICD-10-CM | POA: Diagnosis not present

## 2015-06-02 DIAGNOSIS — M170 Bilateral primary osteoarthritis of knee: Secondary | ICD-10-CM | POA: Insufficient documentation

## 2015-06-02 DIAGNOSIS — Z791 Long term (current) use of non-steroidal anti-inflammatories (NSAID): Secondary | ICD-10-CM | POA: Diagnosis not present

## 2015-06-02 DIAGNOSIS — I10 Essential (primary) hypertension: Secondary | ICD-10-CM | POA: Insufficient documentation

## 2015-06-02 DIAGNOSIS — Z79899 Other long term (current) drug therapy: Secondary | ICD-10-CM | POA: Insufficient documentation

## 2015-06-02 DIAGNOSIS — R109 Unspecified abdominal pain: Secondary | ICD-10-CM

## 2015-06-02 DIAGNOSIS — Z3202 Encounter for pregnancy test, result negative: Secondary | ICD-10-CM | POA: Diagnosis not present

## 2015-06-02 DIAGNOSIS — N2889 Other specified disorders of kidney and ureter: Secondary | ICD-10-CM

## 2015-06-02 LAB — PREGNANCY, URINE: Preg Test, Ur: NEGATIVE

## 2015-06-02 LAB — COMPREHENSIVE METABOLIC PANEL
ALBUMIN: 3.7 g/dL (ref 3.5–5.0)
ALK PHOS: 64 U/L (ref 38–126)
ALT: 33 U/L (ref 14–54)
ANION GAP: 6 (ref 5–15)
AST: 35 U/L (ref 15–41)
BILIRUBIN TOTAL: 0.3 mg/dL (ref 0.3–1.2)
BUN: 10 mg/dL (ref 6–20)
CALCIUM: 8.4 mg/dL — AB (ref 8.9–10.3)
CO2: 24 mmol/L (ref 22–32)
Chloride: 104 mmol/L (ref 101–111)
Creatinine, Ser: 0.84 mg/dL (ref 0.44–1.00)
GFR calc Af Amer: >60 mL/min (ref >60)
GFR calc non Af Amer: >60 mL/min (ref >60)
GLUCOSE: 108 mg/dL — AB (ref 65–99)
Potassium: 3.4 mmol/L — ABNORMAL LOW (ref 3.5–5.1)
SODIUM: 134 mmol/L — AB (ref 135–145)
TOTAL PROTEIN: 7.8 g/dL (ref 6.5–8.1)

## 2015-06-02 LAB — CBC WITH DIFFERENTIAL/PLATELET
Basophils Absolute: 0 10*3/uL (ref 0.0–0.1)
Basophils Relative: 0 % (ref 0–1)
Eosinophils Absolute: 0 10*3/uL (ref 0.0–0.7)
Eosinophils Relative: 1 % (ref 0–5)
HCT: 42.1 % (ref 36.0–46.0)
HEMOGLOBIN: 13.8 g/dL (ref 12.0–15.0)
LYMPHS ABS: 1.1 10*3/uL (ref 0.7–4.0)
Lymphocytes Relative: 14 % (ref 12–46)
MCH: 31.7 pg (ref 26.0–34.0)
MCHC: 32.8 g/dL (ref 30.0–36.0)
MCV: 96.8 fL (ref 78.0–100.0)
MONOS PCT: 9 % (ref 3–12)
Monocytes Absolute: 0.7 10*3/uL (ref 0.1–1.0)
NEUTROS ABS: 5.9 10*3/uL (ref 1.7–7.7)
NEUTROS PCT: 76 % (ref 43–77)
Platelets: 237 10*3/uL (ref 150–400)
RBC: 4.35 MIL/uL (ref 3.87–5.11)
RDW: 13.8 % (ref 11.5–15.5)
WBC: 7.8 10*3/uL (ref 4.0–10.5)

## 2015-06-02 LAB — URINE MICROSCOPIC-ADD ON

## 2015-06-02 LAB — URINALYSIS, ROUTINE W REFLEX MICROSCOPIC
Glucose, UA: NEGATIVE mg/dL
KETONES UR: NEGATIVE mg/dL
NITRITE: NEGATIVE
PROTEIN: 30 mg/dL — AB
Specific Gravity, Urine: 1.025 (ref 1.005–1.030)
UROBILINOGEN UA: 0.2 mg/dL (ref 0.0–1.0)
pH: 6 (ref 5.0–8.0)

## 2015-06-02 LAB — LIPASE, BLOOD: LIPASE: 10 U/L — AB (ref 22–51)

## 2015-06-02 MED ORDER — LOPERAMIDE HCL 2 MG PO TABS: 2.0000 mg | ORAL_TABLET | Freq: Four times a day (QID) | ORAL | Status: DC | PRN

## 2015-06-02 MED ORDER — SODIUM CHLORIDE 0.9 % IV BOLUS (SEPSIS)
1000.0000 mL | Freq: Once | INTRAVENOUS | Status: AC
  Administered 2015-06-02: 1000 mL via INTRAVENOUS

## 2015-06-02 MED ORDER — IOHEXOL 300 MG/ML  SOLN
50.0000 mL | Freq: Once | INTRAMUSCULAR | Status: AC | PRN
  Administered 2015-06-02: 50 mL via ORAL

## 2015-06-02 MED ORDER — ONDANSETRON 4 MG PO TBDP: 4.0000 mg | ORAL_TABLET | Freq: Three times a day (TID) | ORAL | Status: DC | PRN

## 2015-06-02 MED ORDER — SODIUM CHLORIDE 0.9 % IV SOLN
1000.0000 mL | Freq: Once | INTRAVENOUS | Status: AC
  Administered 2015-06-02: 1000 mL via INTRAVENOUS

## 2015-06-02 MED ORDER — HYDROCODONE-ACETAMINOPHEN 5-325 MG PO TABS: 1.0000 | ORAL_TABLET | Freq: Four times a day (QID) | ORAL | Status: DC | PRN

## 2015-06-02 MED ORDER — FENTANYL CITRATE (PF) 100 MCG/2ML IJ SOLN
50.0000 ug | Freq: Once | INTRAMUSCULAR | Status: AC
  Administered 2015-06-02: 50 ug via INTRAVENOUS
  Filled 2015-06-02: qty 2

## 2015-06-02 MED ORDER — SODIUM CHLORIDE 0.9 % IV SOLN: INTRAVENOUS | Status: DC

## 2015-06-02 MED ORDER — IOHEXOL 300 MG/ML  SOLN
100.0000 mL | Freq: Once | INTRAMUSCULAR | Status: AC | PRN
  Administered 2015-06-02: 100 mL via INTRAVENOUS

## 2015-06-02 MED ORDER — ONDANSETRON HCL 4 MG/2ML IJ SOLN
4.0000 mg | Freq: Once | INTRAMUSCULAR | Status: AC
  Administered 2015-06-02: 4 mg via INTRAVENOUS
  Filled 2015-06-02: qty 2

## 2015-06-02 NOTE — ED Notes (Signed)
Patient c/o abdominal pain, vomiting and diarrhea since Thursday .

## 2015-06-02 NOTE — ED Notes (Signed)
Family at bedside. 

## 2015-06-02 NOTE — ED Notes (Signed)
Pt resting quietly, appears comfortable, tol PO contrast well

## 2015-06-02 NOTE — Discharge Instructions (Signed)
Workup for the abdominal pain including the right groin pain and nausea vomiting and diarrhea without significant findings. 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 Also some evidence of some blood in the urine but that may be due to your menstrual cycle. Make an appointment to follow-up with your regular doctor. Also referral provided to urology if you need additional help in evaluating the kidney mass. Take the Imodium right ear as directed. Take Zofran as directed. Take hydrocodone for pain as needed. Work note provided. Return for any new or worse symptoms.

## 2015-06-02 NOTE — ED Notes (Signed)
Presents with abd pain, having nausea, vomiting , diarrhea, since thursday

## 2015-06-02 NOTE — ED Provider Notes (Signed)
CSN: [id number]     Arrival date & time 06/02/15  1106 History   First MD Initiated Contact with Patient 06/02/15 1125     Chief Complaint  Patient presents with  . Emesis     (Consider location/radiation/quality/duration/timing/severity/associated sxs/prior Treatment) Patient is a 49 y.o. female presenting with vomiting. The history is provided by the patient.  Emesis Associated symptoms: abdominal pain and diarrhea   Associated symptoms: no headaches    patient presents with a complaint of nausea vomiting and diarrhea is Thursday. Associated with fever no blood in the bowel movements or the vomit. Also with complaint of abdominal pain, predominantly right side more right lower quadrant. Patient had some pain in that area predominately right groin area for about a month. But now worse. Patient does have a primary care doctor. Patient is currently on her menses. The abdominal pain is 8 out of 10 sharp in nature somewhat intermittent.  Past Medical History  Diagnosis Date  . Lymph edema   . Hypertension   . Peripheral vascular disease     lymphedema - right leg - wears comp hose  . Arthritis     knees  . SVD (spontaneous vaginal delivery) x 2   Past Surgical History  Procedure Laterality Date  . Cholecystectomy    . Knee surgery    . Hysteroscopy w/d&c  10/14/2012    Procedure: DILATATION AND CURETTAGE /HYSTEROSCOPY;  Surgeon: Thurnell Lose, MD;  Location: [facility];  Service: Gynecology;  Laterality: N/A;  wants ultrasound guidance  ask scheduler to remember to tell surgeon to put order in   No family history on file. History  Substance Use Topics  . Smoking status: Never Smoker   . Smokeless tobacco: Never Used  . Alcohol Use: 0.0 oz/week    0 Standard drinks or equivalent per week     Comment: rarely   OB History    No data available     Review of Systems  Constitutional: Positive for fever.  HENT: Negative for congestion.   Eyes: Negative for visual disturbance.   Respiratory: Negative for shortness of breath.   Cardiovascular: Negative for chest pain.  Gastrointestinal: Positive for nausea, vomiting, abdominal pain and diarrhea.  Genitourinary: Negative for dysuria.  Musculoskeletal: Negative for back pain.  Skin: Negative for rash.  Neurological: Negative for headaches.  Hematological: Does not bruise/bleed easily.  Psychiatric/Behavioral: Negative for confusion.      Allergies  Ultram and Shellfish allergy  Home Medications   Prior to Admission medications   Medication Sig Start Date End Date Taking? Authorizing Provider  HYDROcodone-ibuprofen (VICOPROFEN) 7.5-200 MG per tablet Take 1 tablet by mouth every 8 (eight) hours as needed for moderate pain. 02/07/15  Yes Myeong O Sheard, DPM  lisinopril-hydrochlorothiazide (PRINZIDE,ZESTORETIC) 20-12.5 MG per tablet Take 1 tablet by mouth daily.   Yes Historical Provider, MD  nabumetone (RELAFEN) 500 MG tablet Take 1 tablet (500 mg total) by mouth 2 (two) times daily. 02/07/15  Yes Myeong O Sheard, DPM  HYDROcodone-acetaminophen (NORCO/VICODIN) 5-325 MG per tablet Take 1-2 tablets by mouth every 6 (six) hours as needed for moderate pain. 06/02/15   Fredia Sorrow, MD  loperamide (IMODIUM A-D) 2 MG tablet Take 1 tablet (2 mg total) by mouth 4 (four) times daily as needed for diarrhea or loose stools. 06/02/15   Fredia Sorrow, MD  ondansetron (ZOFRAN ODT) 4 MG disintegrating tablet Take 1 tablet (4 mg total) by mouth every 8 (eight) hours as needed for nausea or vomiting. 06/02/15  Fredia Sorrow, MD   BP 144/75 mmHg  Pulse 81  Temp(Src) 98.6 F (37 C) (Oral)  Resp 18  Ht 5\' 6"  (1.676 m)  Wt 340 lb (154.223 kg)  BMI 54.90 kg/m2  SpO2 99%  LMP 06/02/2015 (Exact Date) Physical Exam  Constitutional: She is oriented to person, place, and time. She appears well-developed and well-nourished. No distress.  HENT:  Head: Normocephalic and atraumatic.  Mouth/Throat: Oropharynx is clear and moist.   Eyes: Conjunctivae and EOM are normal. Pupils are equal, round, and reactive to light.  Neck: Normal range of motion. Neck supple.  Cardiovascular: Normal rate, regular rhythm and normal heart sounds.   Pulmonary/Chest: Effort normal and breath sounds normal. No respiratory distress.  Abdominal: Soft. Bowel sounds are normal. There is tenderness.  Right lower quadrant right groin without guarding. Questionable mass right groin. Skin in that area normal.  Musculoskeletal: Normal range of motion.  Bilateral leg swelling but no pitting edema.  Neurological: She is alert and oriented to person, place, and time. No cranial nerve deficit. She exhibits normal muscle tone. Coordination normal.  Skin: Skin is warm. No rash noted. No erythema.  Nursing note and vitals reviewed.   ED Course  Procedures (including critical care time) Labs Review Labs Reviewed  COMPREHENSIVE METABOLIC PANEL - Abnormal; Notable for the following:    Sodium 134 (*)    Potassium 3.4 (*)    Glucose, Bld 108 (*)    Calcium 8.4 (*)    All other components within normal limits  URINALYSIS, ROUTINE W REFLEX MICROSCOPIC (NOT AT [facility]) - Abnormal; Notable for the following:    Color, Urine AMBER (*)    APPearance CLOUDY (*)    Hgb urine dipstick LARGE (*)    Bilirubin Urine SMALL (*)    Protein, ur 30 (*)    Leukocytes, UA TRACE (*)    All other components within normal limits  LIPASE, BLOOD - Abnormal; Notable for the following:    Lipase 10 (*)    All other components within normal limits  URINE MICROSCOPIC-ADD ON - Abnormal; Notable for the following:    Bacteria, UA MANY (*)    All other components within normal limits  URINE CULTURE  CBC WITH DIFFERENTIAL/PLATELET  PREGNANCY, URINE   Results for orders placed or performed during the hospital encounter of 06/02/15  CBC with Differential  Result Value Ref Range   WBC 7.8 4.0 - 10.5 K/uL   RBC 4.35 3.87 - 5.11 MIL/uL   Hemoglobin 13.8 12.0 - 15.0 g/dL    HCT 42.1 36.0 - 46.0 %   MCV 96.8 78.0 - 100.0 fL   MCH 31.7 26.0 - 34.0 pg   MCHC 32.8 30.0 - 36.0 g/dL   RDW 13.8 11.5 - 15.5 %   Platelets 237 150 - 400 K/uL   Neutrophils Relative % 76 43 - 77 %   Neutro Abs 5.9 1.7 - 7.7 K/uL   Lymphocytes Relative 14 12 - 46 %   Lymphs Abs 1.1 0.7 - 4.0 K/uL   Monocytes Relative 9 3 - 12 %   Monocytes Absolute 0.7 0.1 - 1.0 K/uL   Eosinophils Relative 1 0 - 5 %   Eosinophils Absolute 0.0 0.0 - 0.7 K/uL   Basophils Relative 0 0 - 1 %   Basophils Absolute 0.0 0.0 - 0.1 K/uL  Comprehensive metabolic panel  Result Value Ref Range   Sodium 134 (L) 135 - 145 mmol/L   Potassium 3.4 (L) 3.5 -  5.1 mmol/L   Chloride 104 101 - 111 mmol/L   CO2 24 22 - 32 mmol/L   Glucose, Bld 108 (H) 65 - 99 mg/dL   BUN 10 6 - 20 mg/dL   Creatinine, Ser 0.84 0.44 - 1.00 mg/dL   Calcium 8.4 (L) 8.9 - 10.3 mg/dL   Total Protein 7.8 6.5 - 8.1 g/dL   Albumin 3.7 3.5 - 5.0 g/dL   AST 35 15 - 41 U/L   ALT 33 14 - 54 U/L   Alkaline Phosphatase 64 38 - 126 U/L   Total Bilirubin 0.3 0.3 - 1.2 mg/dL   GFR calc non Af Amer >60 >60 mL/min   GFR calc Af Amer >60 >60 mL/min   Anion gap 6 5 - 15  Urinalysis, Routine w reflex microscopic (not at [facility])  Result Value Ref Range   Color, Urine AMBER (A) YELLOW   APPearance CLOUDY (A) CLEAR   Specific Gravity, Urine 1.025 1.005 - 1.030   pH 6.0 5.0 - 8.0   Glucose, UA NEGATIVE NEGATIVE mg/dL   Hgb urine dipstick LARGE (A) NEGATIVE   Bilirubin Urine SMALL (A) NEGATIVE   Ketones, ur NEGATIVE NEGATIVE mg/dL   Protein, ur 30 (A) NEGATIVE mg/dL   Urobilinogen, UA 0.2 0.0 - 1.0 mg/dL   Nitrite NEGATIVE NEGATIVE   Leukocytes, UA TRACE (A) NEGATIVE  Pregnancy, urine  Result Value Ref Range   Preg Test, Ur NEGATIVE NEGATIVE  Lipase, blood  Result Value Ref Range   Lipase 10 (L) 22 - 51 U/L  Urine microscopic-add on  Result Value Ref Range   Squamous Epithelial / LPF RARE RARE   WBC, UA 0-2 <3 WBC/hpf   RBC / HPF 7-10 <3  RBC/hpf   Bacteria, UA MANY (A) RARE   Urine-Other MUCOUS PRESENT      Imaging Review Ct Abdomen Pelvis W Contrast  06/02/2015   CLINICAL DATA:  Patient with nausea, vomiting and diarrhea for 3 days. Diffuse abdominal pain.  EXAM: CT ABDOMEN AND PELVIS WITH CONTRAST  TECHNIQUE: Multidetector CT imaging of the abdomen and pelvis was performed using the standard protocol following bolus administration of intravenous contrast.  CONTRAST:  134mL OMNIPAQUE IOHEXOL 300 MG/ML SOLN, 79mL OMNIPAQUE IOHEXOL 300 MG/ML SOLN  COMPARISON:  Pelvic ultrasound 04/27/2015  FINDINGS: Lower chest: Normal heart size. No consolidative pulmonary opacities. Dependent atelectasis.  Hepatobiliary: Liver is normal in size and contour. Stable sub cm low-attenuation lesion right hepatic lobe, too small to characterize. Patient is post cholecystectomy. No intrahepatic or extrahepatic biliary ductal dilatation.  Pancreas: Unremarkable  Spleen: Unremarkable  Adrenals/Urinary Tract: Normal adrenal glands. Kidneys are symmetric in size. No hydronephrosis. Indeterminate 1.4 cm lesion within the superior pole of the right kidney (image 62; series 5).  Stomach/Bowel: No abnormal bowel wall thickening or evidence for bowel obstruction. No free fluid or free intraperitoneal air.  Vascular/Lymphatic: Normal caliber abdominal aorta. No retroperitoneal lymphadenopathy.  Other: Uterus and adnexal structures are unremarkable.  Musculoskeletal: Lower thoracic and lumbar spinal degenerative changes without aggressive or acute appearing osseous lesions.  IMPRESSION: No acute process within the abdomen or pelvis.  Indeterminate lesion within the superior pole of the right kidney. This needs definitive characterization with pre and post-contrast enhanced MRI in the non acute setting.   Electronically Signed   By: Lovey Newcomer M.D.   On: 06/02/2015 14:27     EKG Interpretation None      MDM   Final diagnoses:  Abdominal pain  Gastroenteritis  Right kidney mass    Patient presents with a complaint of nausea vomiting and diarrhea since Thursday. And abdominal pain predominantly right lower quadrant and right groin has been present for a month. No blood in the vomiting. Symptoms do associated with fever.  CT scan abdomen and pelvis without any acute abdominal findings. However there is evidence of an indeterminate lesion in the spirit pole the right kidney that will require additional workup.  Patient's other symptoms may be related to a gastroenteritis. Labs without any significant abnormalities. Little bit of blood in the urine but patient is on her menses. No significant electrolyte abnormalities or liver function test abnormalities. Pregnancy test negative no leukocytosis no anemia.  Nausea vomiting and diarrhea be treated symptomatically with Imodium right ear and Zofran. The abdominal pain right groin pain with no evidence of any hernia or problem on CT scan will be treated with a short course of hydrocodone.  Patient will make an appointment with their primary care Dr. for follow-up of the renal lesion/mass and also to ensure that the blood resolves in the urine. Patient also given referral information to urology if they need their assistance in further evaluating the renal lesion. Patient improved here with fluids and pain medicine.    Fredia Sorrow, MD 06/02/15 1531

## 2015-06-04 LAB — URINE CULTURE

## 2016-07-23 ENCOUNTER — Other Ambulatory Visit (HOSPITAL_COMMUNITY)
Admission: RE | Admit: 2016-07-23 | Discharge: 2016-07-23 | Disposition: A | Discharge status: Home / Self Care | Payer: BLUE CROSS/BLUE SHIELD | Source: Ambulatory Visit | Attending: Obstetrics and Gynecology | Admitting: Obstetrics and Gynecology

## 2016-07-23 ENCOUNTER — Other Ambulatory Visit: Payer: Self-pay | Admitting: Obstetrics and Gynecology

## 2016-07-23 DIAGNOSIS — Z1151 Encounter for screening for human papillomavirus (HPV): Secondary | ICD-10-CM | POA: Diagnosis present

## 2016-07-23 DIAGNOSIS — Z01419 Encounter for gynecological examination (general) (routine) without abnormal findings: Secondary | ICD-10-CM | POA: Insufficient documentation

## 2016-07-24 LAB — CYTOLOGY - PAP

## 2016-08-07 ENCOUNTER — Other Ambulatory Visit: Payer: Self-pay | Admitting: Obstetrics and Gynecology

## 2016-08-07 DIAGNOSIS — Z1231 Encounter for screening mammogram for malignant neoplasm of breast: Secondary | ICD-10-CM

## 2016-08-20 ENCOUNTER — Ambulatory Visit
Admission: RE | Admit: 2016-08-20 | Discharge: 2016-08-20 | Disposition: A | Discharge status: Home / Self Care | Payer: BLUE CROSS/BLUE SHIELD | Source: Ambulatory Visit | Attending: Obstetrics and Gynecology | Admitting: Obstetrics and Gynecology

## 2016-08-20 DIAGNOSIS — Z1231 Encounter for screening mammogram for malignant neoplasm of breast: Secondary | ICD-10-CM

## 2017-03-11 ENCOUNTER — Other Ambulatory Visit: Payer: Self-pay | Admitting: Obstetrics and Gynecology

## 2017-03-11 DIAGNOSIS — N924 Excessive bleeding in the premenopausal period: Secondary | ICD-10-CM

## 2017-03-18 ENCOUNTER — Other Ambulatory Visit: Payer: BLUE CROSS/BLUE SHIELD

## 2017-03-25 ENCOUNTER — Other Ambulatory Visit: Payer: BLUE CROSS/BLUE SHIELD

## 2017-06-10 ENCOUNTER — Other Ambulatory Visit: Payer: BLUE CROSS/BLUE SHIELD

## 2017-06-17 ENCOUNTER — Ambulatory Visit
Admission: RE | Admit: 2017-06-17 | Discharge: 2017-06-17 | Disposition: A | Discharge status: Home / Self Care | Payer: BLUE CROSS/BLUE SHIELD | Source: Ambulatory Visit | Attending: Obstetrics and Gynecology | Admitting: Obstetrics and Gynecology

## 2017-06-17 DIAGNOSIS — N924 Excessive bleeding in the premenopausal period: Secondary | ICD-10-CM

## 2017-08-05 ENCOUNTER — Other Ambulatory Visit: Payer: Self-pay | Admitting: Obstetrics and Gynecology

## 2017-08-05 DIAGNOSIS — Z1231 Encounter for screening mammogram for malignant neoplasm of breast: Secondary | ICD-10-CM

## 2017-08-21 ENCOUNTER — Ambulatory Visit
Admission: RE | Admit: 2017-08-21 | Discharge: 2017-08-21 | Disposition: A | Discharge status: Home / Self Care | Payer: BLUE CROSS/BLUE SHIELD | Source: Ambulatory Visit | Attending: Obstetrics and Gynecology | Admitting: Obstetrics and Gynecology

## 2017-08-21 DIAGNOSIS — Z1231 Encounter for screening mammogram for malignant neoplasm of breast: Secondary | ICD-10-CM

## 2019-07-26 HISTORY — PX: BREAST BIOPSY: SHX20

## 2019-08-15 ENCOUNTER — Other Ambulatory Visit: Payer: Self-pay | Admitting: Obstetrics and Gynecology

## 2019-08-15 DIAGNOSIS — N63 Unspecified lump in unspecified breast: Secondary | ICD-10-CM

## 2019-08-17 ENCOUNTER — Other Ambulatory Visit: Payer: Self-pay | Admitting: Obstetrics and Gynecology

## 2019-08-17 ENCOUNTER — Ambulatory Visit
Admission: RE | Admit: 2019-08-17 | Discharge: 2019-08-17 | Disposition: A | Discharge status: Home / Self Care | Payer: 59 | Source: Ambulatory Visit | Attending: Obstetrics and Gynecology | Admitting: Obstetrics and Gynecology

## 2019-08-17 ENCOUNTER — Ambulatory Visit
Admission: RE | Admit: 2019-08-17 | Discharge: 2019-08-17 | Disposition: A | Discharge status: Home / Self Care | Payer: BLUE CROSS/BLUE SHIELD | Source: Ambulatory Visit | Attending: Obstetrics and Gynecology | Admitting: Obstetrics and Gynecology

## 2019-08-17 ENCOUNTER — Other Ambulatory Visit: Payer: Self-pay

## 2019-08-17 DIAGNOSIS — N63 Unspecified lump in unspecified breast: Secondary | ICD-10-CM

## 2019-08-17 DIAGNOSIS — N6489 Other specified disorders of breast: Secondary | ICD-10-CM

## 2019-08-22 ENCOUNTER — Ambulatory Visit
Admission: RE | Admit: 2019-08-22 | Discharge: 2019-08-22 | Disposition: A | Discharge status: Home / Self Care | Payer: 59 | Source: Ambulatory Visit | Attending: Obstetrics and Gynecology | Admitting: Obstetrics and Gynecology

## 2019-08-22 ENCOUNTER — Other Ambulatory Visit: Payer: Self-pay | Admitting: Obstetrics and Gynecology

## 2019-08-22 ENCOUNTER — Other Ambulatory Visit (HOSPITAL_COMMUNITY): Payer: Self-pay | Admitting: Diagnostic Radiology

## 2019-08-22 ENCOUNTER — Other Ambulatory Visit: Payer: Self-pay

## 2019-08-22 DIAGNOSIS — N6489 Other specified disorders of breast: Secondary | ICD-10-CM

## 2019-08-23 ENCOUNTER — Encounter: Payer: Self-pay | Admitting: *Deleted

## 2019-08-24 ENCOUNTER — Encounter: Payer: Self-pay | Admitting: *Deleted

## 2019-08-24 ENCOUNTER — Telehealth: Payer: Self-pay | Admitting: Oncology

## 2019-08-24 ENCOUNTER — Other Ambulatory Visit: Payer: Self-pay | Admitting: *Deleted

## 2019-08-24 DIAGNOSIS — Z170 Estrogen receptor positive status [ER+]: Secondary | ICD-10-CM

## 2019-08-24 DIAGNOSIS — C50512 Malignant neoplasm of lower-outer quadrant of left female breast: Secondary | ICD-10-CM

## 2019-08-24 HISTORY — DX: Estrogen receptor positive status (ER+): Z17.0

## 2019-08-24 HISTORY — DX: Malignant neoplasm of lower-outer quadrant of left female breast: C50.512

## 2019-08-24 NOTE — Telephone Encounter (Signed)
Spoke to patient to confirm morning [facility] appointment for 10/7, packet mailed and emailed to patient

## 2019-08-30 ENCOUNTER — Telehealth: Payer: Self-pay | Admitting: Hematology and Oncology

## 2019-08-30 NOTE — Telephone Encounter (Signed)
Spoke with patient to confirm afternoon [facility] appointment for 10/14, patient already has packet

## 2019-08-31 ENCOUNTER — Ambulatory Visit: Payer: 59 | Admitting: Physical Therapy

## 2019-08-31 ENCOUNTER — Inpatient Hospital Stay: Payer: 59 | Admitting: Oncology

## 2019-08-31 ENCOUNTER — Inpatient Hospital Stay: Payer: 59

## 2019-08-31 ENCOUNTER — Ambulatory Visit: Payer: 59 | Admitting: Radiation Oncology

## 2019-08-31 ENCOUNTER — Encounter: Payer: Self-pay | Admitting: *Deleted

## 2019-09-06 NOTE — Progress Notes (Signed)
Radiation Oncology         [phone] ________________________________  Name: Teresa Hayes        MRN: [id number]  Date of Service: 09/07/2019 DOB: 08/26/1966  OI:ZTIWP, Jaymes Graff, MD  [name], MD     REFERRING PHYSICIAN: [name], MD   DIAGNOSIS: The encounter diagnosis was Malignant neoplasm of lower-outer quadrant of left breast of female, estrogen receptor positive ([place]).   HISTORY OF PRESENT ILLNESS: Teresa Hayes is a 53 y.o. female seen in the multidisciplinary breast clinic for a new diagnosis of left breast cancer. The patient was noted to have a palpable mass in the left breast and presented for diagnostic imaging. This revealed a 2.1 x 1.6 x 1.5 cm mass at 3:30 in the left breast and her axilla was negative for adenopathy. 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 She is seen today to discuss treatment options for her cancer.    PREVIOUS RADIATION THERAPY: No   PAST MEDICAL HISTORY:  Past Medical History:  Diagnosis Date  . Arthritis    knees  . Hypertension   . Lymph edema   . Malignant neoplasm of lower-outer quadrant of left breast of female, estrogen receptor positive ([place]) 08/24/2019  . Peripheral vascular disease ([facility])    lymphedema - right leg - wears comp hose  . SVD (spontaneous vaginal delivery) x 2       PAST SURGICAL HISTORY: Past Surgical History:  Procedure Laterality Date  . CHOLECYSTECTOMY    . HYSTEROSCOPY W/D&C  10/14/2012   Procedure: DILATATION AND CURETTAGE /HYSTEROSCOPY;  Surgeon: Thurnell Lose, MD;  Location: [facility];  Service: Gynecology;  Laterality: N/A;  wants ultrasound guidance  ask scheduler to remember to tell surgeon to put order in  . KNEE SURGERY       FAMILY HISTORY: No family history on file.   SOCIAL HISTORY:  reports that she has never smoked. She has never used smokeless tobacco. She reports  current alcohol use. She reports that she does not use drugs. The patient is single and lives in [place]. 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   ALLERGIES: Ultram [tramadol] and Shellfish allergy   MEDICATIONS:  Current Outpatient Medications  Medication Sig Dispense Refill  . HYDROcodone-acetaminophen (NORCO/VICODIN) 5-325 MG per tablet Take 1-2 tablets by mouth every 6 (six) hours as needed for moderate pain. 10 tablet 0  . HYDROcodone-ibuprofen (VICOPROFEN) 7.5-200 MG per tablet Take 1 tablet by mouth every 8 (eight) hours as needed for moderate pain. 60 tablet 0  . lisinopril-hydrochlorothiazide (PRINZIDE,ZESTORETIC) 20-12.5 MG per tablet Take 1 tablet by mouth daily.    Marland Kitchen loperamide (IMODIUM A-D) 2 MG tablet Take 1 tablet (2 mg total) by mouth 4 (four) times daily as needed for diarrhea or loose stools. 30 tablet 0  . nabumetone (RELAFEN) 500 MG tablet Take 1 tablet (500 mg total) by mouth 2 (two) times daily. 60 tablet 1  . ondansetron (ZOFRAN ODT) 4 MG disintegrating tablet Take 1 tablet (4 mg total) by mouth every 8 (eight) hours as needed for nausea or vomiting. 12 tablet 1   No current facility-administered medications for this visit.      REVIEW OF SYSTEMS: On review of systems, the patient reports that she is doing well overall. She denies any chest pain, shortness of breath, cough, fevers, chills, night sweats, unintended weight changes. She denies any  bowel or bladder disturbances, and denies abdominal pain, nausea or vomiting. She denies any new musculoskeletal or joint aches or pains. She does have edema of the lower extremities and a known history of lymphedema. A complete review of systems is obtained and is otherwise negative.     PHYSICAL EXAM:  Wt Readings from Last 3 Encounters:  06/02/15 (!) 340 lb (154.2 kg)  02/07/15 (!) 340 lb (154.2 kg)  10/01/12 (!) 340 lb (154.2 kg)   Temp Readings from Last 3 Encounters:  06/02/15 98.6 F (37 C) (Oral)   10/14/12 98.3 F (36.8 C)  07/22/12 98.6 F (37 C) (Oral)   BP Readings from Last 3 Encounters:  06/02/15 118/67  10/14/12 (!) 98/59  10/11/12 138/80   Pulse Readings from Last 3 Encounters:  06/02/15 66  10/14/12 74  10/11/12 89     In general this is a well appearing caucasian female in no acute distress. She's alert and oriented x4 and appropriate throughout the examination. Cardiopulmonary assessment is negative for acute distress and she exhibits normal effort. Bilateral breast exam is deferred. Lower extremity edema is noted bilaterally.   ECOG = 0  0 - Asymptomatic (Fully active, able to carry on all predisease activities without restriction)  1 - Symptomatic but completely ambulatory (Restricted in physically strenuous activity but ambulatory and able to carry out work of a light or sedentary nature. For example, light housework, office work)  2 - Symptomatic, <50% in bed during the day (Ambulatory and capable of all self care but unable to carry out any work activities. Up and about more than 50% of waking hours)  3 - Symptomatic, >50% in bed, but not bedbound (Capable of only limited self-care, confined to bed or chair 50% or more of waking hours)  4 - Bedbound (Completely disabled. Cannot carry on any self-care. Totally confined to bed or chair)  5 - Death   Eustace Pen MM, Creech RH, Tormey DC, et al. [phone]). "Toxicity and response criteria of the [facility]". [place] Oncol. 5 (6): 649-55    LABORATORY DATA:  Lab Results  Component Value Date   WBC 7.8 06/02/2015   HGB 13.8 06/02/2015   HCT 42.1 06/02/2015   MCV 96.8 06/02/2015   PLT 237 06/02/2015   Lab Results  Component Value Date   NA 134 (L) 06/02/2015   K 3.4 (L) 06/02/2015   CL 104 06/02/2015   CO2 24 06/02/2015   Lab Results  Component Value Date   ALT 33 06/02/2015   AST 35 06/02/2015   ALKPHOS 64 06/02/2015   BILITOT 0.3 06/02/2015      RADIOGRAPHY: US Breast  Ltd Uni Left Inc Axilla  Result Date: 08/17/2019 CLINICAL DATA:  Tender palpable abnormality in the LEFT breast for 6 months. EXAM: DIGITAL DIAGNOSTIC BILATERAL MAMMOGRAM WITH CAD AND TOMO ULTRASOUND LEFT BREAST COMPARISON:  08/21/2017 and earlier ACR Breast Density Category b: There are scattered areas of fibroglandular density. FINDINGS: Within the LOWER OUTER QUADRANT of the LEFT breast there is a focal area of distortion, marked as the area of patient's concern with BB. RIGHT breast is negative. Mammographic images were processed with CAD. On physical exam, I palpate a discrete firm mass in the 4 o'clock location of the LEFT breast near the nipple. Targeted ultrasound is performed, showing irregular hypoechoic palpable mass in the 3:30 o'clock location of the LEFT breast. Mass is associated with posterior acoustic shadowing and is estimated to measure 2.1 x 1.5  x 1.6 centimeters. Evaluation of the LEFT axilla is negative for adenopathy. IMPRESSION: Suspicious mass in the LEFT breast 3:30 o'clock location. RECOMMENDATION: Ultrasound-guided core biopsy of LEFT breast mass. I have discussed the findings and recommendations with the patient. If applicable, a reminder letter will be sent to the patient regarding the next appointment. BI-RADS CATEGORY  4: Suspicious. Electronically Signed   By: Nolon Nations M.D.   On: 08/17/2019 13:59   Mm Diag Breast Tomo Bilateral  Result Date: 08/17/2019 CLINICAL DATA:  Tender palpable abnormality in the LEFT breast for 6 months. EXAM: DIGITAL DIAGNOSTIC BILATERAL MAMMOGRAM WITH CAD AND TOMO ULTRASOUND LEFT BREAST COMPARISON:  08/21/2017 and earlier ACR Breast Density Category b: There are scattered areas of fibroglandular density. FINDINGS: Within the LOWER OUTER QUADRANT of the LEFT breast there is a focal area of distortion, marked as the area of patient's concern with BB. RIGHT breast is negative. Mammographic images were processed with CAD. On physical exam, I  palpate a discrete firm mass in the 4 o'clock location of the LEFT breast near the nipple. Targeted ultrasound is performed, showing irregular hypoechoic palpable mass in the 3:30 o'clock location of the LEFT breast. Mass is associated with posterior acoustic shadowing and is estimated to measure 2.1 x 1.5 x 1.6 centimeters. Evaluation of the LEFT axilla is negative for adenopathy. IMPRESSION: Suspicious mass in the LEFT breast 3:30 o'clock location. RECOMMENDATION: Ultrasound-guided core biopsy of LEFT breast mass. I have discussed the findings and recommendations with the patient. If applicable, a reminder letter will be sent to the patient regarding the next appointment. BI-RADS CATEGORY  4: Suspicious. Electronically Signed   By: Nolon Nations M.D.   On: 08/17/2019 13:59   Mm Clip Placement Left  Result Date: 08/22/2019 CLINICAL DATA:  Confirmation of clip placement after ultrasound-guided core needle biopsy of a mass associated with architectural distortion involving the LOWER OUTER QUADRANT of the LEFT breast. EXAM: DIAGNOSTIC LEFT MAMMOGRAM POST ULTRASOUND BIOPSY COMPARISON:  Previous exam(s). FINDINGS: Mammographic images were obtained following ultrasound guided biopsy of a mass involving the LOWER OUTER QUADRANT of the LEFT breast at the 3:30 o'clock position. The ribbon shaped tissue marker clip is appropriately positioned at the site of the biopsied mass associated with architectural distortion. Expected post biopsy changes are present without evidence of hematoma. IMPRESSION: Appropriate positioning of the ribbon shaped tissue marker clip at the site of the biopsied mass associated with architectural distortion in the LOWER OUTER QUADRANT of the LEFT breast. Final Assessment: Post Procedure Mammograms for Marker Placement Electronically Signed   By: Evangeline Dakin M.D.   On: 08/22/2019 08:25   [place] Lt Breast Bx W Loc Dev 1st Lesion Img Bx Spec US Guide  Addendum Date: 08/23/2019   ADDENDUM  REPORT: 08/23/2019 14:24 ADDENDUM: Pathology revealed GRADE I-II INVASIVE MAMMARY CARCINOMA of the Left breast, lower outer quadrant, 3:30 o'clock. This was found to be concordant by Dr. Peggye Fothergill. Pathology results were discussed with the patient by telephone. The patient reported doing well after the biopsy with tenderness at the site. Post biopsy instructions and care were reviewed and questions were answered. The patient was encouraged to call [facility] for any additional concerns. The patient was referred to [facility] on August 31, 2019. Pathology results reported by Terie Purser, RN on 08/23/2019. Electronically Signed   By: Evangeline Dakin M.D.   On: 08/23/2019 14:24   Result Date:  08/23/2019 CLINICAL DATA:  53 year old who presents with a palpable approximate 2.1 cm mass associated with architectural distortion involving the LOWER OUTER QUADRANT of the LEFT breast the 3:30 o'clock position approximately 9 cm from the nipple. EXAM: ULTRASOUND GUIDED LEFT BREAST CORE NEEDLE BIOPSY COMPARISON:  Previous exam(s). FINDINGS: I met with the patient and we discussed the procedure of ultrasound-guided biopsy, including benefits and alternatives. We discussed the high likelihood of a successful procedure. We discussed the risks of the procedure, including infection, bleeding, tissue injury, clip migration, and inadequate sampling. Informed written consent was given. The usual time-out protocol was performed immediately prior to the procedure. Lesion quadrant: LOWER OUTER QUADRANT. Using sterile technique with chlorhexidine as skin antisepsis, 1% lidocaine and 1% lidocaine with epinephrine as local anesthetic, under direct ultrasound visualization, a 12 gauge Bard Marquee core needle device placed through an 11 gauge introducer needle was used to perform biopsy of the mass in the LOWER OUTER QUADRANT of  the LEFT breast using a LATERAL approach. At the conclusion of the procedure a ribbon shaped tissue marker clip was deployed into the biopsy cavity. Follow up 2 view mammogram was performed and dictated separately. IMPRESSION: Ultrasound guided biopsy of a mass associated with architectural distortion involving the LOWER OUTER QUADRANT of the LEFT breast. No apparent complications. Electronically Signed: By: Evangeline Dakin M.D. On: 08/22/2019 08:24       IMPRESSION/PLAN: 1. Stage IB, [id number] grade 2 ER/PR positive invasive lobular carcinoma of the left breast. Dr. Lisbeth Renshaw discusses the pathology findings and reviews the nature of left lobular breast disease. The consensus from the breast conference includes MRI for extent of disease and then would anticipate breast conservation with lumpectomy with with sentinel node biopsy. Dr. Jana Hakim plans to order an Oncotype Dx score to determine a role for systemic therapy.  Dr. Jana Hakim also recommends beginning antiestrogen therapy prior to surgical intervention. Provided that chemotherapy is not indicated, the patient's course would then be followed by external radiotherapy to the breast followed by antiestrogen therapy. We discussed the risks, benefits, short, and long term effects of radiotherapy, and the patient is interested in proceeding. Dr. Lisbeth Renshaw discusses the delivery and logistics of radiotherapy and anticipates a course of 6 1/2 weeks of radiotherapy. We will see her back about 2 weeks after surgery to discuss the simulation process and anticipate we starting radiotherapy about 4-6 weeks after surgery.  2. Possible genetic predisposition to malignancy. The patient is a candidate for genetic testing given her personal and family history. She was offered referral and is interested in proceeding.  In a visit lasting 45 minutes, greater than 50% of the time was spent face to face discussing her case, and coordinating the patient's care.  The above  documentation reflects my direct findings during this shared patient visit. Please see the separate note by Dr. Lisbeth Renshaw on this date for the remainder of the patient's plan of care.    Carola Rhine, PAC

## 2019-09-06 NOTE — Progress Notes (Signed)
[place]  [phone] [phone]     ID: Teresa Hayes DOB: 1966/02/18  MR#: [id number]  PTW#:[id number]  Patient Care Team: Algis Greenhouse, MD as PCP - General (Unknown Physician Specialty) Mauro Kaufmann, RN as Oncology Nurse Navigator Rockwell Germany, RN as Oncology Nurse Navigator Travaughn Vue, Virgie Dad, MD as Consulting Physician (Oncology) Kyung Rudd, MD as Consulting Physician (Radiation Oncology) [name], MD as Consulting Physician (General Surgery) Micki Cassel, Virgie Dad, MD as Consulting Physician (Oncology) Kyung Rudd, MD as Consulting Physician (Radiation Oncology) Thurnell Lose, MD as Consulting Physician (Obstetrics and Gynecology) Chauncey Cruel, MD OTHER MD:  CHIEF COMPLAINT: estrogen receptor positive breast cancer  CURRENT TREATMENT:    HISTORY OF CURRENT ILLNESS: Teresa Hayes presented to her OBGYN with a tender, palpable left breast abnormality for several months. She was set up for bilateral diagnostic mammography with tomography and left breast ultrasonography at [facility] on 08/17/2019 showing: breast density category B; 2.1 cm left breast mas at 3:30; left axilla negative for adenopathy.  Accordingly on 08/22/2019 she proceeded to biopsy of the left breast area in question. The pathology from this procedure ([id number]) showed: invasive lobular carcinoma, grade 1-2, e-cadherin negative. Prognostic indicators significant for: estrogen receptor, 100% positive and progesterone receptor, 100% positive, both with strong staining intensity. Proliferation marker Ki67 at 10%. HER2 negative by immunohistochemistry (1+).  The patientHayes subsequent history is as detailed below.   INTERVAL HISTORY: Teresa Hayes was evaluated in the multidisciplinary breast cancer clinic on 09/07/2019 . Her case was also presented at the multidisciplinary breast cancer conference on the same day. At that time a preliminary  plan was proposed: MRI, breast conserving surgery with sentinel lymph node sampling, Oncotype, adjuvant radiation, antiestrogens, and genetics.   REVIEW OF SYSTEMS: Teresa Hayes denies unusual headaches, visual changes, nausea, vomiting, stiff neck, dizziness, or gait imbalance. There has been no cough, phlegm production, or pleurisy, no chest pain or pressure, and no change in bowel or bladder habits. The patient denies fever, rash, bleeding or unexplained weight loss.  She has been feeling very tired lately however.  She feels "bad" all over.  She has been having some palpitations.  A detailed review of systems was otherwise entirely negative.   PAST MEDICAL HISTORY: Past Medical History:  Diagnosis Date   Arthritis    knees   Hypertension    Lymph edema    idiopathic   Malignant neoplasm of lower-outer quadrant of left breast of female, estrogen receptor positive ([place]) 08/24/2019   Peripheral vascular disease ([place])    lymphedema - right leg - wears comp hose   SVD (spontaneous vaginal delivery) x 2    PAST SURGICAL HISTORY: Past Surgical History:  Procedure Laterality Date   CHOLECYSTECTOMY     HYSTEROSCOPY W/D&C  10/14/2012   Procedure: DILATATION AND CURETTAGE /HYSTEROSCOPY;  Surgeon: Thurnell Lose, MD;  Location: [facility];  Service: Gynecology;  Laterality: N/A;  wants ultrasound guidance  ask scheduler to remember to tell surgeon to put order in   [place]: Family History  Problem Relation Age of Onset   Pancreatic cancer Maternal Grandmother    PatientHayes father was 14 years old when he died from a ruptured abdominal aortic aneurysm. PatientHayes mother is age 78 (as of 08/2019). The patient denies a family hx of breast or ovarian cancer. She does note pancreatic cancer in her maternal grandmother and some form of GI cancer in her paternal  grandmother. She has 2 siblings, 1 brother and 1 sister.   GYNECOLOGIC HISTORY:  PatientHayes last menstrual  period was 06/02/2015 (exact date). Menarche: 53 years old Age at first live birth: 53 years old [place] P 2 Contraceptive: pills from 1982 - 1985 and injections from 1996 - 2004, 2005 - 2009. HRT: progesterone in 2014, 2018 - 2019,  Hysterectomy? no BSO? 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 She is single. She lives by herself, no pets. Daughter [name], age 68, works as an Web designer in an Merchandiser, retail in [place], [place]. Son [name], age 76, works in Scientist, research (medical) in [place], [place].  The patient has no grandchildren. She does not attend a church.    ADVANCED DIRECTIVES: not in place. She intends to name her daughter, Aixa Corsello, as her 37. ([name] is currently listed as the patientHayes emergency contact).  The appropriate documents for her to complete a notarized were provided on the 09/07/2019 visit   HEALTH MAINTENANCE: Social History   Tobacco Use   Smoking status: Never Smoker   Smokeless tobacco: Never Used  Substance Use Topics   Alcohol use: Yes    Alcohol/week: 0.0 standard drinks    Comment: rarely   Drug use: No     Colonoscopy: none on file  PAP: 06/2016, normal  Bone density: none on file   Allergies  Allergen Reactions   Ultram [Tramadol] Itching   Topiramate Other (See Comments)    Intolerant    Shellfish Allergy Rash    Current Outpatient Medications  Medication Sig Dispense Refill   lisinopril-hydrochlorothiazide (PRINZIDE,ZESTORETIC) 20-12.5 MG per tablet Take 1 tablet by mouth daily.     anastrozole (ARIMIDEX) 1 MG tablet Take 1 tablet (1 mg total) by mouth daily. 90 tablet 4   anastrozole (ARIMIDEX) 1 MG tablet Take 1 tablet (1 mg total) by mouth daily. 90 tablet 4   HYDROcodone-acetaminophen (NORCO/VICODIN) 5-325 MG per tablet Take 1-2 tablets by mouth every 6 (six) hours as needed for moderate pain. (Patient not taking: Reported on 09/07/2019) 10 tablet 0    HYDROcodone-ibuprofen (VICOPROFEN) 7.5-200 MG per tablet Take 1 tablet by mouth every 8 (eight) hours as needed for moderate pain. (Patient not taking: Reported on 09/07/2019) 60 tablet 0   loperamide (IMODIUM A-D) 2 MG tablet Take 1 tablet (2 mg total) by mouth 4 (four) times daily as needed for diarrhea or loose stools. (Patient not taking: Reported on 09/07/2019) 30 tablet 0   nabumetone (RELAFEN) 500 MG tablet Take 1 tablet (500 mg total) by mouth 2 (two) times daily. (Patient not taking: Reported on 09/07/2019) 60 tablet 1   ondansetron (ZOFRAN ODT) 4 MG disintegrating tablet Take 1 tablet (4 mg total) by mouth every 8 (eight) hours as needed for nausea or vomiting. (Patient not taking: Reported on 09/07/2019) 12 tablet 1   No current facility-administered medications for this visit.     OBJECTIVE: Morbidly obese white woman in no acute distress  Vitals:   09/07/19 1241  BP: (!) 150/61  Pulse: 95  Resp: 18  Temp: 98.7 F (37.1 C)  SpO2: 97%     Body mass index is 59.8 kg/m.   Wt Readings from Last 3 Encounters:  09/07/19 (!) 370 lb 8 oz (168.1 kg)  06/02/15 (!) 340 lb (154.2 kg)  02/07/15 (!) 340 lb (154.2 kg)      ECOG FS:1 - Symptomatic but completely ambulatory  Ocular: Sclerae  unicteric, pupils round and equal Ear-nose-throat: Wearing a mask Lymphatic: No cervical or supraclavicular adenopathy Lungs no rales or rhonchi Heart regular rate and rhythm Abd soft, nontender, positive bowel sounds MSK no focal spinal tenderness, no joint edema Neuro: non-focal, well-oriented, appropriate affect Breasts: The right breast is unremarkable.  The left breast is status post recent biopsy.  There is a small ecchymosis.  There are no other skin or nipple changes of concern.  Both axillae are benign.   LAB RESULTS:  CMP     Component Value Date/Time   NA 138 09/07/2019 1220   K 4.2 09/07/2019 1220   CL 102 09/07/2019 1220   CO2 26 09/07/2019 1220   GLUCOSE 106 (H)  09/07/2019 1220   BUN 14 09/07/2019 1220   CREATININE 0.89 09/07/2019 1220   CALCIUM 9.6 09/07/2019 1220   PROT 8.2 (H) 09/07/2019 1220   ALBUMIN 3.5 09/07/2019 1220   AST 15 09/07/2019 1220   ALT 14 09/07/2019 1220   ALKPHOS 87 09/07/2019 1220   BILITOT 0.3 09/07/2019 1220   GFRNONAA >60 09/07/2019 1220   GFRAA >60 09/07/2019 1220    No results found for: TOTALPROTELP, ALBUMINELP, A1GS, A2GS, BETS, BETA2SER, GAMS, MSPIKE, SPEI  No results found for: KPAFRELGTCHN, LAMBDASER, KAPLAMBRATIO  Lab Results  Component Value Date   WBC 8.8 09/07/2019   NEUTROABS 5.4 09/07/2019   HGB 13.3 09/07/2019   HCT 40.1 09/07/2019   MCV 96.9 09/07/2019   PLT 272 09/07/2019    @LASTCHEMISTRY @  No results found for: LABCA2  No components found for: [id number]  No results for input(s): INR in the last 168 hours.  No results found for: LABCA2  No results found for: JXB147  No results found for: WGN562  No results found for: ZHY865  No results found for: CA2729  No components found for: HGQUANT  No results found for: CEA1 / No results found for: CEA1   No results found for: AFPTUMOR  No results found for: CHROMOGRNA  No results found for: PSA1  Appointment on 09/07/2019  Component Date Value Ref Range Status   Sodium 09/07/2019 138  135 - 145 mmol/L Final   Potassium 09/07/2019 4.2  3.5 - 5.1 mmol/L Final   Chloride 09/07/2019 102  98 - 111 mmol/L Final   CO2 09/07/2019 26  22 - 32 mmol/L Final   Glucose, Bld 09/07/2019 106* 70 - 99 mg/dL Final   BUN 09/07/2019 14  6 - 20 mg/dL Final   Creatinine 09/07/2019 0.89  0.44 - 1.00 mg/dL Final   Calcium 09/07/2019 9.6  8.9 - 10.3 mg/dL Final   Total Protein 09/07/2019 8.2* 6.5 - 8.1 g/dL Final   Albumin 09/07/2019 3.5  3.5 - 5.0 g/dL Final   AST 09/07/2019 15  15 - 41 U/L Final   ALT 09/07/2019 14  0 - 44 U/L Final   Alkaline Phosphatase 09/07/2019 87  38 - 126 U/L Final   Total Bilirubin 09/07/2019 0.3  0.3 - 1.2  mg/dL Final   GFR, Est Non Af Am 09/07/2019 >60  >60 mL/min Final   GFR, Est AFR Am 09/07/2019 >60  >60 mL/min Final   Anion gap 09/07/2019 10  5 - 15 Final   Performed at [facility], [place] [address] [place], [place] [address]   WBC Count 09/07/2019 8.8  4.0 - 10.5 K/uL Final   RBC 09/07/2019 4.14  3.87 - 5.11 MIL/uL Final   Hemoglobin 09/07/2019 13.3  12.0 - 15.0  g/dL Final   HCT 09/07/2019 40.1  36.0 - 46.0 % Final   MCV 09/07/2019 96.9  80.0 - 100.0 fL Final   MCH 09/07/2019 32.1  26.0 - 34.0 pg Final   MCHC 09/07/2019 33.2  30.0 - 36.0 g/dL Final   RDW 09/07/2019 14.1  11.5 - 15.5 % Final   Platelet Count 09/07/2019 272  150 - 400 K/uL Final   nRBC 09/07/2019 0.0  0.0 - 0.2 % Final   Neutrophils Relative % 09/07/2019 62  % Final   Neutro Abs 09/07/2019 5.4  1.7 - 7.7 K/uL Final   Lymphocytes Relative 09/07/2019 26  % Final   Lymphs Abs 09/07/2019 2.3  0.7 - 4.0 K/uL Final   Monocytes Relative 09/07/2019 10  % Final   Monocytes Absolute 09/07/2019 0.9  0.1 - 1.0 K/uL Final   Eosinophils Relative 09/07/2019 1  % Final   Eosinophils Absolute 09/07/2019 0.1  0.0 - 0.5 K/uL Final   Basophils Relative 09/07/2019 1  % Final   Basophils Absolute 09/07/2019 0.0  0.0 - 0.1 K/uL Final   Immature Granulocytes 09/07/2019 0  % Final   Abs Immature Granulocytes 09/07/2019 0.02  0.00 - 0.07 K/uL Final   Performed at [facility], [address] [place], [place] [address]    (this displays the last labs from the last 3 days)  No results found for: TOTALPROTELP, ALBUMINELP, A1GS, A2GS, BETS, BETA2SER, GAMS, MSPIKE, SPEI (this displays SPEP labs)  No results found for: KPAFRELGTCHN, LAMBDASER, KAPLAMBRATIO (kappa/lambda light chains)  No results found for: HGBA, HGBA2QUANT, HGBFQUANT, HGBSQUAN (Hemoglobinopathy evaluation)   No results found for: LDH  No results found for: IRON, TIBC, IRONPCTSAT (Iron and  TIBC)  No results found for: FERRITIN  Urinalysis    Component Value Date/Time   COLORURINE AMBER (A) 06/02/2015 1125   APPEARANCEUR CLOUDY (A) 06/02/2015 1125   LABSPEC 1.025 06/02/2015 1125   PHURINE 6.0 06/02/2015 1125   GLUCOSEU NEGATIVE 06/02/2015 1125   HGBUR LARGE (A) 06/02/2015 1125   BILIRUBINUR SMALL (A) 06/02/2015 1125   KETONESUR NEGATIVE 06/02/2015 1125   PROTEINUR 30 (A) 06/02/2015 1125   UROBILINOGEN 0.2 06/02/2015 1125   NITRITE NEGATIVE 06/02/2015 1125   LEUKOCYTESUR TRACE (A) 06/02/2015 1125     STUDIES: US Breast Ltd Uni Left Inc Axilla  Result Date: 08/17/2019 CLINICAL DATA:  Tender palpable abnormality in the LEFT breast for 6 months. EXAM: DIGITAL DIAGNOSTIC BILATERAL MAMMOGRAM WITH CAD AND TOMO ULTRASOUND LEFT BREAST COMPARISON:  08/21/2017 and earlier ACR Breast Density Category b: There are scattered areas of fibroglandular density. FINDINGS: Within the LOWER OUTER QUADRANT of the LEFT breast there is a focal area of distortion, marked as the area of patientHayes concern with BB. RIGHT breast is negative. Mammographic images were processed with CAD. On physical exam, I palpate a discrete firm mass in the 4 o'clock location of the LEFT breast near the nipple. Targeted ultrasound is performed, showing irregular hypoechoic palpable mass in the 3:30 o'clock location of the LEFT breast. Mass is associated with posterior acoustic shadowing and is estimated to measure 2.1 x 1.5 x 1.6 centimeters. Evaluation of the LEFT axilla is negative for adenopathy. IMPRESSION: Suspicious mass in the LEFT breast 3:30 o'clock location. RECOMMENDATION: Ultrasound-guided core biopsy of LEFT breast mass. I have discussed the findings and recommendations with the patient. If applicable, a reminder letter will be sent to the patient regarding the next appointment. BI-RADS CATEGORY  4: Suspicious. Electronically Signed  By: Nolon Nations M.D.   On: 08/17/2019 13:59   Mm Diag Breast Tomo  Bilateral  Result Date: 08/17/2019 CLINICAL DATA:  Tender palpable abnormality in the LEFT breast for 6 months. EXAM: DIGITAL DIAGNOSTIC BILATERAL MAMMOGRAM WITH CAD AND TOMO ULTRASOUND LEFT BREAST COMPARISON:  08/21/2017 and earlier ACR Breast Density Category b: There are scattered areas of fibroglandular density. FINDINGS: Within the LOWER OUTER QUADRANT of the LEFT breast there is a focal area of distortion, marked as the area of patientHayes concern with BB. RIGHT breast is negative. Mammographic images were processed with CAD. On physical exam, I palpate a discrete firm mass in the 4 o'clock location of the LEFT breast near the nipple. Targeted ultrasound is performed, showing irregular hypoechoic palpable mass in the 3:30 o'clock location of the LEFT breast. Mass is associated with posterior acoustic shadowing and is estimated to measure 2.1 x 1.5 x 1.6 centimeters. Evaluation of the LEFT axilla is negative for adenopathy. IMPRESSION: Suspicious mass in the LEFT breast 3:30 o'clock location. RECOMMENDATION: Ultrasound-guided core biopsy of LEFT breast mass. I have discussed the findings and recommendations with the patient. If applicable, a reminder letter will be sent to the patient regarding the next appointment. BI-RADS CATEGORY  4: Suspicious. Electronically Signed   By: Nolon Nations M.D.   On: 08/17/2019 13:59   Mm Clip Placement Left  Result Date: 08/22/2019 CLINICAL DATA:  Confirmation of clip placement after ultrasound-guided core needle biopsy of a mass associated with architectural distortion involving the LOWER OUTER QUADRANT of the LEFT breast. EXAM: DIAGNOSTIC LEFT MAMMOGRAM POST ULTRASOUND BIOPSY COMPARISON:  Previous exam(s). FINDINGS: Mammographic images were obtained following ultrasound guided biopsy of a mass involving the LOWER OUTER QUADRANT of the LEFT breast at the 3:30 o'clock position. The ribbon shaped tissue marker clip is appropriately positioned at the site of the biopsied  mass associated with architectural distortion. Expected post biopsy changes are present without evidence of hematoma. IMPRESSION: Appropriate positioning of the ribbon shaped tissue marker clip at the site of the biopsied mass associated with architectural distortion in the LOWER OUTER QUADRANT of the LEFT breast. Final Assessment: Post Procedure Mammograms for Marker Placement Electronically Signed   By: Teresa Hayes M.D.   On: 08/22/2019 08:25   [place] Lt Breast Bx W Loc Dev 1st Lesion Img Bx Spec US Guide  Addendum Date: 08/23/2019   ADDENDUM REPORT: 08/23/2019 14:24 ADDENDUM: Pathology revealed GRADE I-II INVASIVE MAMMARY CARCINOMA of the Left breast, lower outer quadrant, 3:30 o'clock. This was found to be concordant by Dr. Peggye Fothergill. Pathology results were discussed with the patient by telephone. The patient reported doing well after the biopsy with tenderness at the site. Post biopsy instructions and care were reviewed and questions were answered. The patient was encouraged to call [facility] for any additional concerns. The patient was referred to [facility] on August 31, 2019. Pathology results reported by Terie Purser, RN on 08/23/2019. Electronically Signed   By: Teresa Hayes M.D.   On: 08/23/2019 14:24   Result Date: 08/23/2019 CLINICAL DATA:  53 year old who presents with a palpable approximate 2.1 cm mass associated with architectural distortion involving the LOWER OUTER QUADRANT of the LEFT breast the 3:30 o'clock position approximately 9 cm from the nipple. EXAM: ULTRASOUND GUIDED LEFT BREAST CORE NEEDLE BIOPSY COMPARISON:  Previous exam(s). FINDINGS: I met with the patient and we discussed the procedure of ultrasound-guided biopsy, including  benefits and alternatives. We discussed the high likelihood of a successful procedure. We discussed the risks of the procedure, including  infection, bleeding, tissue injury, clip migration, and inadequate sampling. Informed written consent was given. The usual time-out protocol was performed immediately prior to the procedure. Lesion quadrant: LOWER OUTER QUADRANT. Using sterile technique with chlorhexidine as skin antisepsis, 1% lidocaine and 1% lidocaine with epinephrine as local anesthetic, under direct ultrasound visualization, a 12 gauge Bard Marquee core needle device placed through an 11 gauge introducer needle was used to perform biopsy of the mass in the LOWER OUTER QUADRANT of the LEFT breast using a LATERAL approach. At the conclusion of the procedure a ribbon shaped tissue marker clip was deployed into the biopsy cavity. Follow up 2 view mammogram was performed and dictated separately. IMPRESSION: Ultrasound guided biopsy of a mass associated with architectural distortion involving the LOWER OUTER QUADRANT of the LEFT breast. No apparent complications. Electronically Signed: By: Teresa Hayes M.D. On: 08/22/2019 08:24    ELIGIBLE FOR AVAILABLE RESEARCH PROTOCOL: [place] study  ASSESSMENT: 53 y.o. [place] woman status post left breast lower outer quadrant biopsy 08/22/2019 for a clinical T2 N0, stage   (1) genetics testing   (2) definitive surgery pending  (3) Oncotype to be obtained from the definitive surgical specimen: Chemotherapy not anticipated    (4) adjuvant radiation    (5) anastrozole started 09/08/2019 in anticipation of surgical delays  PLAN: I spent approximately 60 minutes face to face with Teresa Hayes with more than 50% of that time spent in counseling and coordination of care. Specifically we reviewed the biology of the patientHayes diagnosis and the specifics of her situation.  We first reviewed the fact that cancer is not one disease but more than 100 different diseases and that it is important to keep them separate-- otherwise when friends and relatives discuss their own cancer experiences with [place]  confusion can result. Similarly we explained that if breast cancer spreads to the bone or liver, the patient would not have bone cancer or liver cancer, but breast cancer in the bone and breast cancer in the liver: one cancer in three places-- not 3 different cancers which otherwise would have to be treated in 3 different ways.  We discussed the difference between local and systemic therapy. In terms of loco-regional treatment, lumpectomy plus radiation is equivalent to mastectomy as far as survival is concerned. For this reason, and because the cosmetic results are generally superior, we recommend breast conserving surgery.   We also noted that in terms of sequencing of treatments, whether systemic therapy or surgery is done first does not affect the ultimate outcome.  This is relevant to Teresa Hayes case since there may be some surgical delay before she can get cardiology clearance.  Accordingly we will be starting antiestrogens neoadjuvantly  We then discussed the rationale for systemic therapy. There is some risk that this cancer may have already spread to other parts of her body. Patients frequently ask at this point about bone scans, CAT scans and PET scans to find out if they have occult breast cancer somewhere else. The problem is that in early stage disease we are much more likely to find false positives then true cancers and this would expose the patient to unnecessary procedures as well as unnecessary radiation. Scans cannot answer the question the patient really would like to know, which is whether she has microscopic disease elsewhere in her body.   Teresa Hayes understands this but she is very concerned  that she is feeling weak, and feels "bad all over" and has pain in the left breast which is very unusual for breast cancer.  She would feel much more at ease if we could demonstrate there is no obvious metastatic disease and for that reason we have set her up for a CT scan of the chest.  Next we went  over the options for systemic therapy which are anti-estrogens, anti-HER-2 immunotherapy, and chemotherapy. Teresa Hayes does not meet criteria for anti-HER-2 immunotherapy. She is a good candidate for anti-estrogens.  The question of chemotherapy is more complicated. Chemotherapy is most effective in rapidly growing, aggressive tumors. It is much less effective in low-grade, slow growing cancers, like Teresa Hayes. For that reason we are going to request an Oncotype from the definitive surgical sample, as suggested by NCCN guidelines. That will help [place] make a definitive decision regarding chemotherapy in this case.  The plan then is to temporize with antiestrogens, then proceed to cardiology consult, breast MRI, breast conserving surgery, and Oncotype.  We discussed anastrozole in detail and she has a good understanding of the possible toxicities side effects and complications of this agent.  I have gone ahead and placed the prescription in for her.  She will let us know if she has any problems from this medication.  Assuming she does not receive chemotherapy, I would normally see her again late January 2021.  However I have asked her to consider switching to my partners in [place] which might be much more convenient for her and if she is doing fine has no concerns regarding continuing anastrozole or other issues regarding radiation or surgery, she will let us know and we will facilitate that transfer  Teresa Hayes has a good understanding of the overall plan. She agrees with it. She knows the goal of treatment in her case is cure. She will call with any problems that may develop before her next visit here.   Chauncey Cruel, MD   09/07/2019 4:02 PM Medical Oncology and Hematology [facility] [place], [place] [address] Tel. [phone]    Fax. [phone]   This document serves as a record of services personally performed by Lurline Del, MD. It was created on his behalf by  Wilburn Mylar, a trained medical scribe. The creation of this record is based on the scribeHayes personal observations and the providerHayes statements to them.   I, Lurline Del MD, have reviewed the above documentation for accuracy and completeness, and I agree with the above.

## 2019-09-07 ENCOUNTER — Inpatient Hospital Stay: Payer: 59 | Attending: Oncology | Admitting: Oncology

## 2019-09-07 ENCOUNTER — Ambulatory Visit: Payer: 59 | Attending: General Surgery | Admitting: Physical Therapy

## 2019-09-07 ENCOUNTER — Ambulatory Visit (HOSPITAL_BASED_OUTPATIENT_CLINIC_OR_DEPARTMENT_OTHER): Payer: 59 | Admitting: Licensed Clinical Social Worker

## 2019-09-07 ENCOUNTER — Encounter: Payer: Self-pay | Admitting: Physical Therapy

## 2019-09-07 ENCOUNTER — Ambulatory Visit
Admission: RE | Admit: 2019-09-07 | Discharge: 2019-09-07 | Disposition: A | Discharge status: Home / Self Care | Payer: 59 | Source: Ambulatory Visit | Attending: Radiation Oncology | Admitting: Radiation Oncology

## 2019-09-07 ENCOUNTER — Encounter: Payer: Self-pay | Admitting: Oncology

## 2019-09-07 ENCOUNTER — Other Ambulatory Visit: Payer: Self-pay

## 2019-09-07 ENCOUNTER — Other Ambulatory Visit: Payer: Self-pay | Admitting: General Surgery

## 2019-09-07 ENCOUNTER — Inpatient Hospital Stay: Payer: 59

## 2019-09-07 ENCOUNTER — Encounter: Payer: Self-pay | Admitting: Licensed Clinical Social Worker

## 2019-09-07 VITALS — BP 150/61 | HR 95 | Temp 98.7°F | Resp 18 | Ht 66.0 in | Wt 370.5 lb

## 2019-09-07 DIAGNOSIS — Z170 Estrogen receptor positive status [ER+]: Secondary | ICD-10-CM | POA: Insufficient documentation

## 2019-09-07 DIAGNOSIS — I890 Lymphedema, not elsewhere classified: Secondary | ICD-10-CM | POA: Insufficient documentation

## 2019-09-07 DIAGNOSIS — C50512 Malignant neoplasm of lower-outer quadrant of left female breast: Secondary | ICD-10-CM | POA: Diagnosis not present

## 2019-09-07 DIAGNOSIS — R293 Abnormal posture: Secondary | ICD-10-CM | POA: Insufficient documentation

## 2019-09-07 DIAGNOSIS — R262 Difficulty in walking, not elsewhere classified: Secondary | ICD-10-CM | POA: Insufficient documentation

## 2019-09-07 DIAGNOSIS — Z79811 Long term (current) use of aromatase inhibitors: Secondary | ICD-10-CM | POA: Insufficient documentation

## 2019-09-07 DIAGNOSIS — Z791 Long term (current) use of non-steroidal anti-inflammatories (NSAID): Secondary | ICD-10-CM | POA: Insufficient documentation

## 2019-09-07 DIAGNOSIS — Z79899 Other long term (current) drug therapy: Secondary | ICD-10-CM | POA: Insufficient documentation

## 2019-09-07 DIAGNOSIS — I10 Essential (primary) hypertension: Secondary | ICD-10-CM | POA: Insufficient documentation

## 2019-09-07 DIAGNOSIS — Z800 Family history of malignant neoplasm of digestive organs: Secondary | ICD-10-CM

## 2019-09-07 DIAGNOSIS — Z809 Family history of malignant neoplasm, unspecified: Secondary | ICD-10-CM

## 2019-09-07 LAB — CMP (CANCER CENTER ONLY)
ALT: 14 U/L (ref 0–44)
AST: 15 U/L (ref 15–41)
Albumin: 3.5 g/dL (ref 3.5–5.0)
Alkaline Phosphatase: 87 U/L (ref 38–126)
Anion gap: 10 (ref 5–15)
BUN: 14 mg/dL (ref 6–20)
CO2: 26 mmol/L (ref 22–32)
Calcium: 9.6 mg/dL (ref 8.9–10.3)
Chloride: 102 mmol/L (ref 98–111)
Creatinine: 0.89 mg/dL (ref 0.44–1.00)
GFR, Est AFR Am: >60 mL/min (ref >60)
GFR, Estimated: >60 mL/min (ref >60)
Glucose, Bld: 106 mg/dL — ABNORMAL HIGH (ref 70–99)
Potassium: 4.2 mmol/L (ref 3.5–5.1)
Sodium: 138 mmol/L (ref 135–145)
Total Bilirubin: 0.3 mg/dL (ref 0.3–1.2)
Total Protein: 8.2 g/dL — ABNORMAL HIGH (ref 6.5–8.1)

## 2019-09-07 LAB — CBC WITH DIFFERENTIAL (CANCER CENTER ONLY)
Abs Immature Granulocytes: 0.02 10*3/uL (ref 0.00–0.07)
Basophils Absolute: 0 10*3/uL (ref 0.0–0.1)
Basophils Relative: 1 %
Eosinophils Absolute: 0.1 10*3/uL (ref 0.0–0.5)
Eosinophils Relative: 1 %
HCT: 40.1 % (ref 36.0–46.0)
Hemoglobin: 13.3 g/dL (ref 12.0–15.0)
Immature Granulocytes: 0 %
Lymphocytes Relative: 26 %
Lymphs Abs: 2.3 10*3/uL (ref 0.7–4.0)
MCH: 32.1 pg (ref 26.0–34.0)
MCHC: 33.2 g/dL (ref 30.0–36.0)
MCV: 96.9 fL (ref 80.0–100.0)
Monocytes Absolute: 0.9 10*3/uL (ref 0.1–1.0)
Monocytes Relative: 10 %
Neutro Abs: 5.4 10*3/uL (ref 1.7–7.7)
Neutrophils Relative %: 62 %
Platelet Count: 272 10*3/uL (ref 150–400)
RBC: 4.14 MIL/uL (ref 3.87–5.11)
RDW: 14.1 % (ref 11.5–15.5)
WBC Count: 8.8 10*3/uL (ref 4.0–10.5)
nRBC: 0 % (ref 0.0–0.2)

## 2019-09-07 MED ORDER — ANASTROZOLE 1 MG PO TABS: 1.0000 mg | ORAL_TABLET | Freq: Every day | ORAL | 4 refills | Status: DC

## 2019-09-07 NOTE — Therapy (Signed)
[place], [place], [address] Phone: [phone]   Fax:  [phone]  Physical Therapy Evaluation  Patient Details  Name: Teresa Hayes MRN: [id number] Date of Birth: 1966-02-11 Referring Provider (PT): Dr. [name]   Encounter Date: 09/07/2019  PT End of Session - 09/07/19 2145    Visit Number  1    Number of Visits  2    Date for PT Re-Evaluation  11/02/19    PT Start Time  3664    PT Stop Time  1452    PT Time Calculation (min)  30 min    Activity Tolerance  Patient tolerated treatment well    Behavior During Therapy  [facility] for tasks assessed/performed       Past Medical History:  Diagnosis Date  . Arthritis    knees  . Family history of pancreatic cancer   . Hypertension   . Lymph edema    idiopathic  . Malignant neoplasm of lower-outer quadrant of left breast of female, estrogen receptor positive ([place]) 08/24/2019  . Peripheral vascular disease (HCC)    lymphedema - right leg - wears comp hose  . SVD (spontaneous vaginal delivery) x 2    Past Surgical History:  Procedure Laterality Date  . CHOLECYSTECTOMY    . HYSTEROSCOPY W/D&C  10/14/2012   Procedure: DILATATION AND CURETTAGE /HYSTEROSCOPY;  Surgeon: Thurnell Lose, MD;  Location: [facility];  Service: Gynecology;  Laterality: N/A;  wants ultrasound guidance  ask scheduler to remember to tell surgeon to put order in  . KNEE SURGERY      There were no vitals filed for this visit.   Subjective Assessment - 09/07/19 2135    Subjective  Patient reports she is here today to be seen for her newly diagnosed left breast cancer.    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 It measures 2.1 cm and is located in the lower outer quadrant. It is ER/PR positive and HER2 negative with a Ki67 of 10%. She has primary stage 3 lymphedema of bilateral LEs which has been  present for many years and never well controlled. This is likely exaccerbated by her morbid obesity is has the typical appearance of lymphedema rather than venous insufficiency. There is no venous staining present.    Patient Stated Goals  Reduce UE lymphedema risk and learn post op shoulder ROM HEP; she also requests help for her leg lymphedema    Currently in Pain?  Yes    Pain Score  5     Pain Location  --   Back, legs   Pain Orientation  Right;Left    Pain Descriptors / Indicators  Aching    Pain Type  Chronic pain    Pain Onset  More than a month ago    Aggravating Factors   Walking    Pain Relieving Factors  Rest / elevation of legs         [facility] PT Assessment - 09/07/19 0001      Assessment   Medical Diagnosis  Left breast cancer    Referring Provider (PT)  Dr. [name]    Onset Date/Surgical Date  08/17/19    Hand Dominance  Right    Prior Therapy  none      Precautions   Precautions  Other (comment)    Precaution Comments  active cancer      Restrictions   Weight Bearing  Restrictions  No      Balance Screen   Has the patient fallen in the past 6 months  No    Has the patient had a decrease in activity level because of a fear of falling?   No    Is the patient reluctant to leave their home because of a fear of falling?   No      Home Environment   Living Environment  Private residence    Living Arrangements  Alone    Available Help at Discharge  Family      Prior Function   Level of Independence  Independent with household mobility without device    Vocation  Full time employment    Vocation Requirements  dispatcher for [employer]  She is unable to exercise due to recently feeling faint and weak when walking      Cognition   Overall Cognitive Status  Within Functional Limits for tasks assessed      Posture/Postural Control   Posture/Postural Control  Postural limitations    Postural Limitations  Rounded Shoulders;Forward head;Flexed  trunk      ROM / Strength   AROM / PROM / Strength  AROM;Strength      AROM   Overall AROM Comments  Cervical AROM is WNL    AROM Assessment Site  Shoulder    Right/Left Shoulder  Right;Left    Right Shoulder Extension  54 Degrees    Right Shoulder Flexion  125 Degrees    Right Shoulder ABduction  135 Degrees    Right Shoulder Internal Rotation  50 Degrees    Right Shoulder External Rotation  55 Degrees    Left Shoulder Extension  70 Degrees    Left Shoulder Flexion  110 Degrees    Left Shoulder ABduction  129 Degrees    Left Shoulder Internal Rotation  45 Degrees    Left Shoulder External Rotation  70 Degrees      Strength   Overall Strength  Within functional limits for tasks performed        LYMPHEDEMA/ONCOLOGY QUESTIONNAIRE - 09/07/19 2143      Type   Cancer Type  Left breast cancer      Lymphedema Assessments   Lymphedema Assessments  Upper extremities      Right Upper Extremity Lymphedema   10 cm Proximal to Olecranon Process  55 cm    Olecranon Process  33.2 cm    10 cm Proximal to Ulnar Styloid Process  36.8 cm    Just Proximal to Ulnar Styloid Process  25.2 cm    Across Hand at [employer]  20.1 cm    At [place] of 2nd Digit  6.7 cm      Left Upper Extremity Lymphedema   10 cm Proximal to Olecranon Process  53.5 cm    Olecranon Process  33.8 cm    10 cm Proximal to Ulnar Styloid Process  36.4 cm    Just Proximal to Ulnar Styloid Process  24.2 cm    Across Hand at [employer]  20.2 cm    At [place] of 2nd Digit  6.4 cm          Quick Dash - 09/07/19 0001    Open a tight or new jar  Moderate difficulty    Do heavy household chores (wash walls, wash floors)  Severe difficulty    Carry a shopping bag or briefcase  Severe difficulty    Wash  your back  Moderate difficulty    Use a knife to cut food  No difficulty    Recreational activities in which you take some force or impact through your arm, shoulder, or hand (golf, hammering, tennis)  Unable     During the past week, to what extent has your arm, shoulder or hand problem interfered with your normal social activities with family, friends, neighbors, or groups?  Slightly    During the past week, to what extent has your arm, shoulder or hand problem limited your work or other regular daily activities  Slightly    Arm, shoulder, or hand pain.  Moderate    Tingling (pins and needles) in your arm, shoulder, or hand  Moderate    Difficulty Sleeping  Severe difficulty    DASH Score  52.27 %        Objective measurements completed on examination: See above findings.      Patient was instructed today in a home exercise program today for post op shoulder range of motion. These included active assist shoulder flexion in sitting, scapular retraction, wall walking with shoulder abduction, and hands behind head external rotation.  She was encouraged to do these twice a day, holding 3 seconds and repeating 5 times when permitted by her physician.     PT Education - 09/07/19 2144    Education Details  Lymphedema risk reduction and post op shoulder ROM HEP    Person(s) Educated  Patient    Methods  Explanation;Demonstration;Handout    Comprehension  Returned demonstration;Verbalized understanding          PT Long Term Goals - 09/07/19 2152      PT LONG TERM GOAL #1   Title  Patient will demonstrate she has regained full shoulder ROM and function post operatively compared to baselines.    Time  8    Period  Weeks    Status  New    Target Date  11/02/19      Breast Clinic Goals - 09/07/19 2151      Patient will be able to verbalize understanding of pertinent lymphedema risk reduction practices relevant to her diagnosis specifically related to skin care.   Time  1    Period  Days    Status  Achieved      Patient will be able to return demonstrate and/or verbalize understanding of the post-op home exercise program related to regaining shoulder range of motion.   Time  1    Period   Days    Status  Achieved      Patient will be able to verbalize understanding of the importance of attending the postoperative After Breast Cancer Class for further lymphedema risk reduction education and therapeutic exercise.   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 It measures 2.1 cm and is located in the lower outer quadrant. It is ER/PR positive and HER2 negative with a Ki67 of 10%. She has primary stage 3 lymphedema of bilateral LEs which has been present for many years and never well controlled. This is likely exaccerbated by her morbid obesity is has the typical appearance of lymphedema rather than venous insufficiency. There is no venous staining present. Her multidisciplinary medical team met prior to her  assessments today to determine a recommended treatment plan. She is planning to have a left lumpectomy and sentinel node biopsy followed by Oncotype testing, radiation, and anti-estrogen therapy. She will benefit from a post op PT visit to determine needs.Her bilateral LE lymphedema will likely need to be addressed post operatively to allow her to begin a walking program for reducing her risk of a breast cancer recurrence. She has recently been having weakness and feeling that she may faint when she walks more than a minute or so. She is being referred to cardiology to be cleared before surgery.    Personal Factors and Comorbidities  Social Background;Fitness;Comorbidity 1   Lives alone   Comorbidities  BLE severe lymphedema    Examination-Activity Limitations  Locomotion Level    Stability/Clinical Decision Making  Evolving/Moderate complexity    Clinical Decision Making  Moderate    Rehab Potential  Good    PT Frequency  --   Eval and 1 f/u to reassess and determine plan   PT Next Visit Plan  Will reassess 3-4  weeks post op to determine needs; may consider BLE lymphedema treatment    PT Home Exercise Plan  Post op shoulder ROM HEP    Consulted and Agree with Plan of Care  Patient       Patient will benefit from skilled therapeutic intervention in order to improve the following deficits and impairments:  Difficulty walking, Increased edema, Postural dysfunction, Decreased range of motion, Impaired UE functional use, Pain, Decreased knowledge of precautions  Visit Diagnosis: Malignant neoplasm of lower-outer quadrant of left breast of female, estrogen receptor positive ([place]) - Plan: PT plan of care cert/re-cert  Abnormal posture - Plan: PT plan of care cert/re-cert  Lymphedema, not elsewhere classified - Plan: PT plan of care cert/re-cert  Difficulty in walking, not elsewhere classified - Plan: PT plan of care cert/re-cert   Patient will follow up at outpatient cancer rehab 3-4 weeks following surgery.  If the patient requires physical therapy at that time, a specific plan will be dictated and sent to the referring physician for approval. The patient was educated today on appropriate basic range of motion exercises to begin post operatively and the importance of attending the After Breast Cancer class following surgery.  Patient was educated today on lymphedema risk reduction practices as it pertains to recommendations that will benefit the patient immediately following surgery.  She verbalized good understanding.      Problem List Patient Active Problem List   Diagnosis Date Noted  . Family history of pancreatic cancer   . Malignant neoplasm of lower-outer quadrant of left breast of female, estrogen receptor positive ([place]) 08/24/2019  . Tendonitis, Achilles, left 02/07/2015  . Equinus deformity of foot, acquired 02/07/2015  . Menometrorrhagia 10/14/2012   Annia Friendly, PT 09/07/19 9:54 PM  [place], [place], [address] Phone: [phone]   Fax:  [phone]  Name: Teresa Hayes MRN: [id number] Date of Birth: Oct 15, 1966

## 2019-09-07 NOTE — Patient Instructions (Signed)

## 2019-09-07 NOTE — Progress Notes (Signed)
REFERRING PROVIDER: Chauncey Cruel, MD [address] [place],  [place] [address]  PRIMARY PROVIDER:  Algis Greenhouse, MD  PRIMARY REASON FOR VISIT:  1. Family history of pancreatic cancer   2. Malignant neoplasm of lower-outer quadrant of left breast of female, estrogen receptor positive ([place])     I connected with Ms. Reaves on 09/07/2019 at 4:00 PM EDT by Webex and verified that I am speaking with the correct person using two identifiers.    Patient location: [facility] Provider location: office  HISTORY OF PRESENT ILLNESS:   Ms. Bourne, a 53 y.o. female, was seen for a [place] cancer genetics consultation at the request of Dr. Jana Hakim due to a personal and family history of cancer.  Ms. Peruski presents to clinic today to discuss the possibility of a hereditary predisposition to cancer, genetic testing, and to further clarify her future cancer risks, as well as potential cancer risks for family members.   In 2020, at the age of 45, Ms. Onken was diagnosed with [facility] of the left breast, ER/PR+, Her2-. The treatment plan includes surgery (currently planning lumpectomy), chemotherapy unlikely, radiation.    CANCER HISTORY:  Oncology History   No history exists.     RISK FACTORS:  Menarche was at age 69.  First live birth at age 56.  OCP use for approximately 3 years.  Ovaries intact: yes.  Hysterectomy: no.  Menopausal status: postmenopausal.  HRT use: 2 years. Up to date with pelvic exams: yes.  Past Medical History:  Diagnosis Date  . Arthritis    knees  . Family history of pancreatic cancer   . Hypertension   . Lymph edema    idiopathic  . Malignant neoplasm of lower-outer quadrant of left breast of female, estrogen receptor positive ([place]) 08/24/2019  . Peripheral vascular disease (HCC)    lymphedema - right leg - wears comp hose  . SVD (spontaneous vaginal delivery) x 2    Past Surgical History:  Procedure Laterality Date  . CHOLECYSTECTOMY    .  HYSTEROSCOPY W/D&C  10/14/2012   Procedure: DILATATION AND CURETTAGE /HYSTEROSCOPY;  Surgeon: Thurnell Lose, MD;  Location: [facility];  Service: Gynecology;  Laterality: N/A;  wants ultrasound guidance  ask scheduler to remember to tell surgeon to put order in  . KNEE SURGERY      Social History   Socioeconomic History  . Marital status: Single    Spouse name: Not on file  . Number of children: Not on file  . Years of education: Not on file  . Highest education level: Not on file  Occupational History  . Not on file  Social Needs  . Financial resource strain: Not on file  . Food insecurity    Worry: Not on file    Inability: Not on file  . Transportation needs    Medical: Not on file    Non-medical: Not on file  Tobacco Use  . Smoking status: Never Smoker  . Smokeless tobacco: Never Used  Substance and Sexual Activity  . Alcohol use: Yes    Alcohol/week: 0.0 standard drinks    Comment: rarely  . Drug use: No  . Sexual activity: Not Currently    Birth control/protection: None  Lifestyle  . Physical activity    Days per week: Not on file    Minutes per session: Not on file  . Stress: Not on file  Relationships  . Social connections    Talks on phone: Not on file  Gets together: Not on file    Attends religious service: Not on file    Active member of club or organization: Not on file    Attends meetings of clubs or organizations: Not on file    Relationship status: Not on file  Other Topics Concern  . Not on file  Social History Narrative  . Not on file     FAMILY HISTORY:  We obtained a detailed, 4-generation family history.  Significant diagnoses are listed below: Family History  Problem Relation Age of Onset  . Pancreatic cancer Maternal Grandmother        dx 58s  . Cancer Maternal Grandfather        unk type, possibly GI   Ms. Riera has a 1 son and 1 daughter, no cancers. She has 1 brother, 1 sister, no cancers.   Ms. Cudney mother is living at  44, no history of cancer. Patient has 1 maternal uncle, 2 maternal aunts, no cancers. No known cancers in maternal cousins. She reports limited information about this side of the family. Maternal grandmother had pancreatic cancer in her 26s and died in her 20s. Maternal grandfather had cancer, she is unsure the type, possibly GI.   Ms. Taaffe's father died at 31, no history of cancer. Patient had 1 paternal aunt, 3 paternal uncles no cancers. No paternal cousins. Paternal grandmother died at 3, paternal grandfather died in his 34s.   Ms. Stephenson is unaware of previous family history of genetic testing for hereditary cancer risks. Patient's maternal ancestors are of Irish/[place] descent, and paternal ancestors are of [place] descent. There is no reported Ashkenazi Jewish ancestry. There is no known consanguinity.  GENETIC COUNSELING ASSESSMENT: Ms. Whipple is a 53 y.o. female with a personal and family history which is somewhat suggestive of a hereditary cancer syndrome and predisposition to cancer. We, therefore, discussed and recommended the following at today's visit.   DISCUSSION: We discussed that 5 - 10% of breast is hereditary, with most cases associated with BRCA1/BRCA2 mutations.  There are other genes that can be associated with hereditary breast cancer syndromes. We discussed that testing is beneficial for several reasons including surgical decision-making for breast cancer, knowing how to follow individuals after completing their treatment, and understand if other family members could be at risk for cancer and allow them to undergo genetic testing.   We reviewed the characteristics, features and inheritance patterns of hereditary cancer syndromes. We also discussed genetic testing, including the appropriate family members to test, the process of testing, insurance coverage and turn-around-time for results. We discussed the implications of a negative, positive and/or variant of uncertain  significant result. In order to get genetic test results in a timely manner so that Ms. Kipper can use these genetic test results for surgical decisions, we recommended Ms. Weimann pursue genetic testing for the [facility] Breast Cancer StAT panel. Once complete, we recommend Ms. Utt pursue reflex genetic testing to the [facility] Common Hereditary cancers gene panel.   The STAT Breast cancer panel offered by [facility] includes sequencing and rearrangement analysis for the following 9 genes:  ATM, BRCA1, BRCA2, CDH1, CHEK2, PALB2, PTEN, STK11 and TP53.    The Common Hereditary Cancers Panel offered by [facility] includes sequencing and/or deletion duplication testing of the following 48 genes: APC, ATM, AXIN2, BARD1, BMPR1A, BRCA1, BRCA2, BRIP1, CDH1, CDKN2A (p14ARF), CDKN2A (p16INK4a), CKD4, CHEK2, CTNNA1, DICER1, EPCAM (Deletion/duplication testing only), GREM1 (promoter region deletion/duplication testing only), KIT, MEN1, MLH1, MSH2, MSH3, MSH6, MUTYH, NBN, NF1,  NHTL1, PALB2, PDGFRA, PMS2, POLD1, POLE, PTEN, RAD50, RAD51C, RAD51D, RNF43, SDHB, SDHC, SDHD, SMAD4, SMARCA4. STK11, TP53, TSC1, TSC2, and VHL.  The following genes were evaluated for sequence changes only: SDHA and HOXB13 c.251G>A variant only.  Based on Ms. Cespedes's personal and family history of cancer, she meets medical criteria for genetic testing. Despite that she meets criteria, she may still have an out of pocket cost.   PLAN: After considering the risks, benefits, and limitations, Ms. Bless provided informed consent to pursue genetic testing and the blood sample was sent to [facility] for analysis of the STAT Breast Cancer Panel + Common Hereditary Cancers Panel. Results should be available within approximately 5-12 days' time, at which point they will be disclosed by telephone to Ms. Hofmeister, as will any additional recommendations warranted by these results. Ms. Galla will receive a summary of her genetic counseling visit and  a copy of her results once available. This information will also be available in Epic.   Ms. Dockstader questions were answered to her satisfaction today. Our contact information was provided should additional questions or concerns arise. Thank you for the referral and allowing [place] to share in the care of your patient.   Faith Rogue, MS, [facility] Genetic Counselor [email or web address] Phone: [phone]  The patient was seen for a total of 15 minutes in virtual genetic counseling.  Drs. Magrinat, Lindi Adie and/or Burr Medico were available for discussion regarding this case.   _______________________________________________________________________ For Office Staff:  Number of people involved in session: 1 Was an Intern/ student involved with case: no

## 2019-09-08 ENCOUNTER — Telehealth: Payer: Self-pay | Admitting: Oncology

## 2019-09-08 NOTE — Telephone Encounter (Signed)
I talk with patient regarding schedule  

## 2019-09-12 ENCOUNTER — Other Ambulatory Visit: Payer: Self-pay

## 2019-09-12 ENCOUNTER — Telehealth: Payer: Self-pay | Admitting: Cardiology

## 2019-09-12 ENCOUNTER — Encounter: Payer: Self-pay | Admitting: *Deleted

## 2019-09-12 ENCOUNTER — Ambulatory Visit (INDEPENDENT_AMBULATORY_CARE_PROVIDER_SITE_OTHER): Payer: 59 | Admitting: Cardiology

## 2019-09-12 ENCOUNTER — Encounter: Payer: Self-pay | Admitting: Cardiology

## 2019-09-12 VITALS — BP 160/110 | HR 93 | Ht 66.0 in | Wt 376.0 lb

## 2019-09-12 DIAGNOSIS — I10 Essential (primary) hypertension: Secondary | ICD-10-CM | POA: Diagnosis not present

## 2019-09-12 DIAGNOSIS — R5383 Other fatigue: Secondary | ICD-10-CM

## 2019-09-12 DIAGNOSIS — R0602 Shortness of breath: Secondary | ICD-10-CM

## 2019-09-12 DIAGNOSIS — R079 Chest pain, unspecified: Secondary | ICD-10-CM

## 2019-09-12 MED ORDER — LISINOPRIL 20 MG PO TABS: 20.0000 mg | ORAL_TABLET | Freq: Every day | ORAL | 3 refills | Status: AC

## 2019-09-12 MED ORDER — LISINOPRIL-HYDROCHLOROTHIAZIDE 20-12.5 MG PO TABS: 1.0000 | ORAL_TABLET | Freq: Every day | ORAL | Status: AC

## 2019-09-12 MED ORDER — NITROGLYCERIN 0.4 MG SL SUBL: 0.4000 mg | SUBLINGUAL_TABLET | SUBLINGUAL | 3 refills | Status: DC | PRN

## 2019-09-12 MED ORDER — HYDROCHLOROTHIAZIDE 12.5 MG PO CAPS: 12.5000 mg | ORAL_CAPSULE | Freq: Every day | ORAL | 1 refills | Status: DC

## 2019-09-12 MED ORDER — LISINOPRIL 40 MG PO TABS: 40.0000 mg | ORAL_TABLET | Freq: Every day | ORAL | 1 refills | Status: DC

## 2019-09-12 NOTE — Progress Notes (Signed)
Cardiology Office Note:    Date:  09/12/2019   ID:  Hayes, Teresa 1966/04/21, MRN [id number]  PCP:  Algis Greenhouse, MD  Cardiologist:  No primary care provider on file.  Electrophysiologist:  None   Referring MD: [name], MD   The patient was referred by her pcp for intermittent chest pain, shortness of breath and generalized fatigued.  History of Present Illness:    Teresa Hayes is a 53 y.o. 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  Patient is planning surgery (lumpectomy), and radiation.  However she has been referred to cardiology given the fact that she had complained of intermittent chest pain worsening shortness of breath as well as generalized fatigue. She tells me that the chest pain is midsternal feels a burning sensation and is intermittent.  She says when it comes to last about 5 to 10 minutes prior to resolution.  She denies any radiation of this pain.  But further tells me that she is more bothered by the worsening/progressive shortness of breath.  She said over the last 4 to 5 months she experienced shortness of breath but in the last month or 2 this has gotten worse that she avoids doing her ADLs because of a short of breath she would get.  She tells me even basic activities as taking a shower make her so short of breath that she is always worried when she gets ready to take a shower.  States today she is walking from her car to the front door which is less than 2 feet she got significant short of breath that she has so sit down prior to talking to the intake nurse. He also reports generalized fatigue and intermittent dizziness.  The patient tells me that this is bothersome because she does have significant family history of premature coronary artery disease with her father early MIs at 23, all of his siblings had their first MI in their 71s early 71s).  She states that she had a stress test  over 10 years ago and this was normal.   Past Medical History:  Diagnosis Date   Arthritis    knees   Family history of pancreatic cancer    Hypertension    Lymph edema    idiopathic   Malignant neoplasm of lower-outer quadrant of left breast of female, estrogen receptor positive ([place]) 08/24/2019   Peripheral vascular disease ([place])    lymphedema - right leg - wears comp hose   SVD (spontaneous vaginal delivery) x 2    Past Surgical History:  Procedure Laterality Date   CHOLECYSTECTOMY     HYSTEROSCOPY W/D&C  10/14/2012   Procedure: DILATATION AND CURETTAGE /HYSTEROSCOPY;  Surgeon: Thurnell Lose, MD;  Location: [facility];  Service: Gynecology;  Laterality: N/A;  wants ultrasound guidance  ask scheduler to remember to tell surgeon to put order in   [place] Right     Current Medications: Current Meds  Medication Sig   anastrozole (ARIMIDEX) 1 MG tablet Take 1 tablet (1 mg total) by mouth daily.   [DISCONTINUED] lisinopril-hydrochlorothiazide (PRINZIDE,ZESTORETIC) 20-12.5 MG per tablet Take 1 tablet by mouth daily.     Allergies:   Ultram [tramadol], Topiramate, and Shellfish allergy   Social History   Socioeconomic History   Marital status: Single    Spouse name: Not on file   Number of children: Not on file   Years of education: Not on file  Highest education level: Not on file  Occupational History   Not on file  Social Needs   Financial resource strain: Not on file   Food insecurity    Worry: Not on file    Inability: Not on file   Transportation needs    Medical: Not on file    Non-medical: Not on file  Tobacco Use   Smoking status: Never Smoker   Smokeless tobacco: Never Used  Substance and Sexual Activity   Alcohol use: Yes    Alcohol/week: 0.0 standard drinks    Comment: rarely   Drug use: No   Sexual activity: Not Currently    Birth control/protection: None  Lifestyle   Physical activity    Days per week: Not on file     Minutes per session: Not on file   Stress: Not on file  Relationships   Social connections    Talks on phone: Not on file    Gets together: Not on file    Attends religious service: Not on file    Active member of club or organization: Not on file    Attends meetings of clubs or organizations: Not on file    Relationship status: Not on file  Other Topics Concern   Not on file  Social History Narrative   Not on file     Family History: The patient's family history includes Cancer in her maternal grandfather; Pancreatic cancer in her maternal grandmother.  ROS:   Review of Systems  Constitution: Negative for decreased appetite, fever and weight gain.  HENT: Negative for congestion, ear discharge, hoarse voice and sore throat.   Eyes: Negative for discharge, redness, vision loss in right eye and visual halos.  Cardiovascular: Negative for chest pain, dyspnea on exertion, leg swelling, orthopnea and palpitations.  Respiratory: Negative for cough, hemoptysis, shortness of breath and snoring.   Endocrine: Negative for heat intolerance and polyphagia.  Hematologic/Lymphatic: Negative for bleeding problem. Does not bruise/bleed easily.  Skin: Negative for flushing, nail changes, rash and suspicious lesions.  Musculoskeletal: Negative for arthritis, joint pain, muscle cramps, myalgias, neck pain and stiffness.  Gastrointestinal: Negative for abdominal pain, bowel incontinence, diarrhea and excessive appetite.  Genitourinary: Negative for decreased libido, genital sores and incomplete emptying.  Neurological: Negative for brief paralysis, focal weakness, headaches and loss of balance.  Psychiatric/Behavioral: Negative for altered mental status, depression and suicidal ideas.  Allergic/Immunologic: Negative for HIV exposure and persistent infections.    EKGs/Labs/Other Studies Reviewed:    The following studies were reviewed today:   EKG:  The ekg ordered today demonstrates sinus  rhythm, heart rate 95 bpm.  Recent Labs: 09/07/2019: ALT 14; BUN 14; Creatinine 0.89; Hemoglobin 13.3; Platelet Count 272; Potassium 4.2; Sodium 138  Recent Lipid Panel No results found for: CHOL, TRIG, HDL, CHOLHDL, VLDL, LDLCALC, LDLDIRECT  Physical Exam:    VS:  BP (!) 160/110 (BP Location: Right Arm, Patient Position: Sitting, Cuff Size: Large)    Pulse 93    Ht 5' 6"  (1.676 m)    Wt (!) 376 lb (170.6 kg)    LMP 06/02/2015 (Exact Date)    SpO2 95%    BMI 60.69 kg/m     Wt Readings from Last 3 Encounters:  09/12/19 (!) 376 lb (170.6 kg)  09/07/19 (!) 370 lb 8 oz (168.1 kg)  06/02/15 (!) 340 lb (154.2 kg)     GEN: The patient is morbidly obese.  Well nourished, well developed in no acute distress HEENT: Normal NECK:  No JVD; No carotid bruits LYMPHATICS: No lymphadenopathy CARDIAC: S1S2 noted,RRR, no murmurs, rubs, gallops RESPIRATORY:  Clear to auscultation without rales, wheezing or rhonchi  ABDOMEN: Soft, non-tender, non-distended, +bowel sounds, no guarding. EXTREMITIES: No edema, No cyanosis, no clubbing MUSCULOSKELETAL:  No edema; No deformity  SKIN: Warm and dry NEUROLOGIC:  Alert and oriented x 3, non-focal PSYCHIATRIC:  Normal affect, good insight  ASSESSMENT:    1. Chest pain of uncertain etiology   2. Chest pain, unspecified type   3. Shortness of breath   4. Fatigue, unspecified type   5. Essential hypertension   6. Morbid obesity ([place])    PLAN:    1.  She does have concerning symptoms for angina and its equivalents.  This is a difficult situation due to the fact that her pending surgery is for a lumpectomy, but given all the risk factors and her current symptoms her risk for perioperative cardiac events it is intermediate to high.  Therefore the appropriate thing to do at this time is to pursue an ischemic evaluation.  I will today order a pharmacologic nuclear stress test (I have  requested an expedited test ) which I explained the patient about this testing.   She is agreeable to proceed with this understanding that this could slow down the process for her surgery.    In addition sublingual nitroglycerin prescription was sent, its protocol and 911 protocol explained and the patient vocalized understanding questions were answered to the patient's satisfaction.  2.  A transthoracic echocardiogram will be appropriate to assess for any valvular abnormalities, LV/RV function.  And also get a baseline prior to her chemo or radiation therapy.  3. She is hypertensive in the office today therefore I am going to increase her lisinopril 20 mg to lisinopril 40 mg.  She will remain on hydrochlorothiazide 12.5 mg daily.  4.  Insert blood work BMP and mag.  5.  The patient understands the need to lose weight with diet and exercise. We have discussed specific strategies for this.   The patient is in agreement with the above plan. The patient left the office in stable condition.  The patient will follow up in 2 months or earlier if needed.   Medication Adjustments/Labs and Tests Ordered: Current medicines are reviewed at length with the patient today.  Concerns regarding medicines are outlined above.  Orders Placed This Encounter  Procedures   Basic Metabolic Panel (BMET)   Magnesium   MYOCARDIAL PERFUSION IMAGING   EKG 12-Lead   ECHOCARDIOGRAM COMPLETE   Meds ordered this encounter  Medications   lisinopril (ZESTRIL) 40 MG tablet    Sig: Take 1 tablet (40 mg total) by mouth daily.    Dispense:  90 tablet    Refill:  1   hydrochlorothiazide (MICROZIDE) 12.5 MG capsule    Sig: Take 1 capsule (12.5 mg total) by mouth daily.    Dispense:  90 capsule    Refill:  1   nitroGLYCERIN (NITROSTAT) 0.4 MG SL tablet    Sig: Place 1 tablet (0.4 mg total) under the tongue every 5 (five) minutes as needed for chest pain.    Dispense:  30 tablet    Refill:  3    Patient Instructions  Medication Instructions:  Your physician has recommended you make the  following change in your medication:   STOP : Zestoretic(Lisinopril/HCTZ)  START : Lisinopril 40 mg Take 1 tab daily START: Hydrochlorothiazide 12.5 mg Take 1 tab daily  Nitroglycerin 0.4 mg sublingual (  under your tongue) as needed for chest pain. If experiencing chest pain, stop what you are doing and sit down. Take 1 nitroglycerin and wait 5 minutes. If chest pain continues, take another nitroglycerin and wait 5 minutes. If chest pain does not subside, take 1 more nitroglycerin and dial 911. You make take a total of 3 nitroglycerin in a 15 minute time frame.   *If you need a refill on your cardiac medications before your next appointment, please call your pharmacy*  Lab Work: Your physician recommends that you return for lab work in: TODAY BMP,Magnesium  If you have labs (blood work) drawn today and your tests are completely normal, you will receive your results only by:  [place] (if you have MyChart) OR  A paper copy in the mail If you have any lab test that is abnormal or we need to change your treatment, we will call you to review the results.  Testing/Procedures:Your physician has requested that you have an echocardiogram. Echocardiography is a painless test that uses sound waves to create images of your heart. It provides your doctor with information about the size and shape of your heart and how well your hearts chambers and valves are working. This procedure takes approximately one hour. There are no restrictions for this procedure.  Your physician has requested that you have a lexiscan myoview. For further information please visit [email or web address]. Please follow instruction sheet, as given.   Follow-Up: At [facility], you and your health needs are our priority.  As part of our continuing mission to provide you with exceptional heart care, we have created designated Provider Care Teams.  These Care Teams include your primary Cardiologist (physician) and Advanced  Practice Providers (APPs -  Physician Assistants and Nurse Practitioners) who all work together to provide you with the care you need, when you need it.  Your next appointment:   2 months  The format for your next appointment:   In Person  Provider:   [name], DO  Other Instructions  Cardiac Nuclear Scan A cardiac nuclear scan is a test that is done to check the flow of blood to your heart. It is done when you are resting and when you are exercising. The test looks for problems such as:  Not enough blood reaching a portion of the heart.  The heart muscle not working as it should. You may need this test if:  You have heart disease.  You have had lab results that are not normal.  You have had heart surgery or a balloon procedure to open up blocked arteries (angioplasty).  You have chest pain.  You have shortness of breath. In this test, a special dye (tracer) is put into your bloodstream. The tracer will travel to your heart. A camera will then take pictures of your heart to see how the tracer moves through your heart. This test is usually done at a hospital and takes 2-4 hours. Tell a doctor about:  Any allergies you have.  All medicines you are taking, including vitamins, herbs, eye drops, creams, and over-the-counter medicines.  Any problems you or family members have had with anesthetic medicines.  Any blood disorders you have.  Any surgeries you have had.  Any medical conditions you have.  Whether you are pregnant or may be pregnant. What are the risks? Generally, this is a safe test. However, problems may occur, such as:  Serious chest pain and heart attack. This is only a risk if the stress portion  of the test is done.  Rapid heartbeat.  A feeling of warmth in your chest. This feeling usually does not last long.  Allergic reaction to the tracer. What happens before the test?  Ask your doctor about changing or stopping your normal medicines. This is  important.  Follow instructions from your doctor about what you cannot eat or drink.  Remove your jewelry on the day of the test. What happens during the test?  An IV tube will be inserted into one of your veins.  Your doctor will give you a small amount of tracer through the IV tube.  You will wait for 20-40 minutes while the tracer moves through your bloodstream.  Your heart will be monitored with an electrocardiogram (ECG).  You will lie down on an exam table.  Pictures of your heart will be taken for about 15-20 minutes.  You may also have a stress test. For this test, one of these things may be done: ? You will be asked to exercise on a treadmill or a stationary bike. ? You will be given medicines that will make your heart work harder. This is done if you are unable to exercise.  When blood flow to your heart has peaked, a tracer will again be given through the IV tube.  After 20-40 minutes, you will get back on the exam table. More pictures will be taken of your heart.  Depending on the tracer that is used, more pictures may need to be taken 3-4 hours later.  Your IV tube will be removed when the test is over. The test may vary among doctors and hospitals. What happens after the test?  Ask your doctor: ? Whether you can return to your normal schedule, including diet, activities, and medicines. ? Whether you should drink more fluids. This will help to remove the tracer from your body. Drink enough fluid to keep your pee (urine) pale yellow.  Ask your doctor, or the department that is doing the test: ? When will my results be ready? ? How will I get my results? Summary  A cardiac nuclear scan is a test that is done to check the flow of blood to your heart.  Tell your doctor whether you are pregnant or may be pregnant.  Before the test, ask your doctor about changing or stopping your normal medicines. This is important.  Ask your doctor whether you can return to  your normal activities. You may be asked to drink more fluids. This information is not intended to replace advice given to you by your health care provider. Make sure you discuss any questions you have with your health care provider. Document Released: 04/26/2018 Document Revised: 03/02/2019 Document Reviewed: 04/26/2018 Elsevier Patient Education  [place].  Echocardiogram An echocardiogram is a procedure that uses painless sound waves (ultrasound) to produce an image of the heart. Images from an echocardiogram can provide important information about:  Signs of coronary artery disease (CAD).  Aneurysm detection. An aneurysm is a weak or damaged part of an artery wall that bulges out from the normal force of blood pumping through the body.  Heart size and shape. Changes in the size or shape of the heart can be associated with certain conditions, including heart failure, aneurysm, and CAD.  Heart muscle function.  Heart valve function.  Signs of a past heart attack.  Fluid buildup around the heart.  Thickening of the heart muscle.  A tumor or infectious growth around the heart valves. Tell  a health care provider about:  Any allergies you have.  All medicines you are taking, including vitamins, herbs, eye drops, creams, and over-the-counter medicines.  Any blood disorders you have.  Any surgeries you have had.  Any medical conditions you have.  Whether you are pregnant or may be pregnant. What are the risks? Generally, this is a safe procedure. However, problems may occur, including:  Allergic reaction to dye (contrast) that may be used during the procedure. What happens before the procedure? No specific preparation is needed. You may eat and drink normally. What happens during the procedure?   An IV tube may be inserted into one of your veins.  You may receive contrast through this tube. A contrast is an injection that improves the quality of the pictures  from your heart.  A gel will be applied to your chest.  A wand-like tool (transducer) will be moved over your chest. The gel will help to transmit the sound waves from the transducer.  The sound waves will harmlessly bounce off of your heart to allow the heart images to be captured in real-time motion. The images will be recorded on a computer. The procedure may vary among health care providers and hospitals. What happens after the procedure?  You may return to your normal, everyday life, including diet, activities, and medicines, unless your health care provider tells you not to do that. Summary  An echocardiogram is a procedure that uses painless sound waves (ultrasound) to produce an image of the heart.  Images from an echocardiogram can provide important information about the size and shape of your heart, heart muscle function, heart valve function, and fluid buildup around your heart.  You do not need to do anything to prepare before this procedure. You may eat and drink normally.  After the echocardiogram is completed, you may return to your normal, everyday life, unless your health care provider tells you not to do that. This information is not intended to replace advice given to you by your health care provider. Make sure you discuss any questions you have with your health care provider. Document Released: 11/07/2000 Document Revised: 03/03/2019 Document Reviewed: 12/13/2016 Elsevier Patient Education  2020 [employer].     Adopting a Healthy Lifestyle.  Know what a healthy weight is for you (roughly BMI <25) and aim to maintain this   Aim for 7+ servings of fruits and vegetables daily   65-80+ fluid ounces of water or unsweet tea for healthy kidneys   Limit to max 1 drink of alcohol per day; avoid smoking/tobacco   Limit animal fats in diet for cholesterol and heart health - choose grass fed whenever available   Avoid highly processed foods, and foods high in  saturated/trans fats   Aim for low stress - take time to unwind and care for your mental health   Aim for 150 min of moderate intensity exercise weekly for heart health, and weights twice weekly for bone health   Aim for 7-9 hours of sleep daily   When it comes to diets, agreement about the perfect plan isnt easy to find, even among the experts. Experts at the [employer] developed an idea known as the Healthy Eating Plate. Just imagine a plate divided into logical, healthy portions.   The emphasis is on diet quality:   Load up on vegetables and fruits - one-half of your plate: Aim for color and variety, and remember that potatoes dont count.   Go for  whole grains - one-quarter of your plate: Whole wheat, barley, wheat berries, quinoa, oats, brown rice, and foods made with them. If you want pasta, go with whole wheat pasta.   Protein power - one-quarter of your plate: Fish, chicken, beans, and nuts are all healthy, versatile protein sources. Limit red meat.   The diet, however, does go beyond the plate, offering a few other suggestions.   Use healthy plant oils, such as olive, canola, soy, corn, sunflower and peanut. Check the labels, and avoid partially hydrogenated oil, which have unhealthy trans fats.   If youre thirsty, drink water. Coffee and tea are good in moderation, but skip sugary drinks and limit milk and dairy products to one or two daily servings.   The type of carbohydrate in the diet is more important than the amount. Some sources of carbohydrates, such as vegetables, fruits, whole grains, and beans-are healthier than others.   Finally, stay active  Signed, [name], DO  09/12/2019 3:24 PM    [facility]

## 2019-09-12 NOTE — Patient Instructions (Signed)
Medication Instructions:  Your physician has recommended you make the following change in your medication:   STOP : Zestoretic(Lisinopril/HCTZ)  START : Lisinopril 40 mg Take 1 tab daily START: Hydrochlorothiazide 12.5 mg Take 1 tab daily  Nitroglycerin 0.4 mg sublingual (under your tongue) as needed for chest pain. If experiencing chest pain, stop what you are doing and sit down. Take 1 nitroglycerin and wait 5 minutes. If chest pain continues, take another nitroglycerin and wait 5 minutes. If chest pain does not subside, take 1 more nitroglycerin and dial 911. You make take a total of 3 nitroglycerin in a 15 minute time frame.   *If you need a refill on your cardiac medications before your next appointment, please call your pharmacy*  Lab Work: Your physician recommends that you return for lab work in: TODAY BMP,Magnesium  If you have labs (blood work) drawn today and your tests are completely normal, you will receive your results only by: Marland Kitchen MyChart Message (if you have MyChart) OR . A paper copy in the mail If you have any lab test that is abnormal or we need to change your treatment, we will call you to review the results.  Testing/Procedures:Your physician has requested that you have an echocardiogram. Echocardiography is a painless test that uses sound waves to create images of your heart. It provides your doctor with information about the size and shape of your heart and how well your heart's chambers and valves are working. This procedure takes approximately one hour. There are no restrictions for this procedure.  Your physician has requested that you have a lexiscan myoview. For further information please visit [email or web address]. Please follow instruction sheet, as given.   Follow-Up: At [facility], you and your health needs are our priority.  As part of our continuing mission to provide you with exceptional heart care, we have created designated Provider Care Teams.  These  Care Teams include your primary Cardiologist (physician) and Advanced Practice Providers (APPs -  Physician Assistants and Nurse Practitioners) who all work together to provide you with the care you need, when you need it.  Your next appointment:   2 months  The format for your next appointment:   In Person  Provider:   [name], DO  Other Instructions  Cardiac Nuclear Scan A cardiac nuclear scan is a test that is done to check the flow of blood to your heart. It is done when you are resting and when you are exercising. The test looks for problems such as:  Not enough blood reaching a portion of the heart.  The heart muscle not working as it should. You may need this test if:  You have heart disease.  You have had lab results that are not normal.  You have had heart surgery or a balloon procedure to open up blocked arteries (angioplasty).  You have chest pain.  You have shortness of breath. In this test, a special dye (tracer) is put into your bloodstream. The tracer will travel to your heart. A camera will then take pictures of your heart to see how the tracer moves through your heart. This test is usually done at a hospital and takes 2-4 hours. Tell a doctor about:  Any allergies you have.  All medicines you are taking, including vitamins, herbs, eye drops, creams, and over-the-counter medicines.  Any problems you or family members have had with anesthetic medicines.  Any blood disorders you have.  Any surgeries you have had.  Any medical conditions  you have.  Whether you are pregnant or may be pregnant. What are the risks? Generally, this is a safe test. However, problems may occur, such as:  Serious chest pain and heart attack. This is only a risk if the stress portion of the test is done.  Rapid heartbeat.  A feeling of warmth in your chest. This feeling usually does not last long.  Allergic reaction to the tracer. What happens before the test?  Ask  your doctor about changing or stopping your normal medicines. This is important.  Follow instructions from your doctor about what you cannot eat or drink.  Remove your jewelry on the day of the test. What happens during the test?  An IV tube will be inserted into one of your veins.  Your doctor will give you a small amount of tracer through the IV tube.  You will wait for 20-40 minutes while the tracer moves through your bloodstream.  Your heart will be monitored with an electrocardiogram (ECG).  You will lie down on an exam table.  Pictures of your heart will be taken for about 15-20 minutes.  You may also have a stress test. For this test, one of these things may be done: ? You will be asked to exercise on a treadmill or a stationary bike. ? You will be given medicines that will make your heart work harder. This is done if you are unable to exercise.  When blood flow to your heart has peaked, a tracer will again be given through the IV tube.  After 20-40 minutes, you will get back on the exam table. More pictures will be taken of your heart.  Depending on the tracer that is used, more pictures may need to be taken 3-4 hours later.  Your IV tube will be removed when the test is over. The test may vary among doctors and hospitals. What happens after the test?  Ask your doctor: ? Whether you can return to your normal schedule, including diet, activities, and medicines. ? Whether you should drink more fluids. This will help to remove the tracer from your body. Drink enough fluid to keep your pee (urine) pale yellow.  Ask your doctor, or the department that is doing the test: ? When will my results be ready? ? How will I get my results? Summary  A cardiac nuclear scan is a test that is done to check the flow of blood to your heart.  Tell your doctor whether you are pregnant or may be pregnant.  Before the test, ask your doctor about changing or stopping your normal  medicines. This is important.  Ask your doctor whether you can return to your normal activities. You may be asked to drink more fluids. This information is not intended to replace advice given to you by your health care provider. Make sure you discuss any questions you have with your health care provider. Document Released: 04/26/2018 Document Revised: 03/02/2019 Document Reviewed: 04/26/2018 Elsevier Patient Education  [place].  Echocardiogram An echocardiogram is a procedure that uses painless sound waves (ultrasound) to produce an image of the heart. Images from an echocardiogram can provide important information about:  Signs of coronary artery disease (CAD).  Aneurysm detection. An aneurysm is a weak or damaged part of an artery wall that bulges out from the normal force of blood pumping through the body.  Heart size and shape. Changes in the size or shape of the heart can be associated with certain conditions, including  heart failure, aneurysm, and CAD.  Heart muscle function.  Heart valve function.  Signs of a past heart attack.  Fluid buildup around the heart.  Thickening of the heart muscle.  A tumor or infectious growth around the heart valves. Tell a health care provider about:  Any allergies you have.  All medicines you are taking, including vitamins, herbs, eye drops, creams, and over-the-counter medicines.  Any blood disorders you have.  Any surgeries you have had.  Any medical conditions you have.  Whether you are pregnant or may be pregnant. What are the risks? Generally, this is a safe procedure. However, problems may occur, including:  Allergic reaction to dye (contrast) that may be used during the procedure. What happens before the procedure? No specific preparation is needed. You may eat and drink normally. What happens during the procedure?   An IV tube may be inserted into one of your veins.  You may receive contrast through this  tube. A contrast is an injection that improves the quality of the pictures from your heart.  A gel will be applied to your chest.  A wand-like tool (transducer) will be moved over your chest. The gel will help to transmit the sound waves from the transducer.  The sound waves will harmlessly bounce off of your heart to allow the heart images to be captured in real-time motion. The images will be recorded on a computer. The procedure may vary among health care providers and hospitals. What happens after the procedure?  You may return to your normal, everyday life, including diet, activities, and medicines, unless your health care provider tells you not to do that. Summary  An echocardiogram is a procedure that uses painless sound waves (ultrasound) to produce an image of the heart.  Images from an echocardiogram can provide important information about the size and shape of your heart, heart muscle function, heart valve function, and fluid buildup around your heart.  You do not need to do anything to prepare before this procedure. You may eat and drink normally.  After the echocardiogram is completed, you may return to your normal, everyday life, unless your health care provider tells you not to do that. This information is not intended to replace advice given to you by your health care provider. Make sure you discuss any questions you have with your health care provider. Document Released: 11/07/2000 Document Revised: 03/03/2019 Document Reviewed: 12/13/2016 Elsevier Patient Education  2020 [employer].

## 2019-09-13 ENCOUNTER — Encounter: Payer: Self-pay | Admitting: *Deleted

## 2019-09-13 LAB — BASIC METABOLIC PANEL
BUN/Creatinine Ratio: 16 (ref 9–23)
BUN: 13 mg/dL (ref 6–24)
CO2: 22 mmol/L (ref 20–29)
Calcium: 9.4 mg/dL (ref 8.7–10.2)
Chloride: 100 mmol/L (ref 96–106)
Creatinine, Ser: 0.83 mg/dL (ref 0.57–1.00)
GFR calc Af Amer: 92 mL/min/{1.73_m2} (ref >59)
GFR calc non Af Amer: 80 mL/min/{1.73_m2} (ref >59)
Glucose: 92 mg/dL (ref 65–99)
Potassium: 4.3 mmol/L (ref 3.5–5.2)
Sodium: 137 mmol/L (ref 134–144)

## 2019-09-13 LAB — MAGNESIUM: Magnesium: 2.2 mg/dL (ref 1.6–2.3)

## 2019-09-13 NOTE — Progress Notes (Signed)
[place] Work  Clinical Social Work received referral for financial stress and resources.  CSW contacted patient at home to offer support and assess for needs.  CSW left patient at detailed message offing support and encouraging patient to return call.  Johnnye Lana, MSW, LCSW, OSW-C Clinical Social Worker [facility] [phone]

## 2019-09-14 ENCOUNTER — Encounter: Payer: Self-pay | Admitting: *Deleted

## 2019-09-14 ENCOUNTER — Telehealth: Payer: Self-pay | Admitting: Oncology

## 2019-09-14 ENCOUNTER — Ambulatory Visit (HOSPITAL_COMMUNITY): Payer: 59

## 2019-09-14 NOTE — Progress Notes (Signed)
Teresa Hayes  [employer] representative spoke with patient by phone to offer support at assess for needs.  Patient stated her primary concern was financial stress due to increased medical bills.  CSW and patient discussed common feelings and emotions when being diagnosed with cancer.  CSW provided education on the support team and support services at [facility].  Patient expressed interest in support programs and was agreeable to an [place] referral, and was added to the support calendar mailing list.  CSW and patient reviewed financial resources.  CSW emailed patient information and applications for the [place] copy in [place], and the [employer].  CSW also encouraged patient to contact [facility] billing to discuss the Access Once program.  Patient plans to collect required documentation, and complete the patient portion of the applications.  Once patient completes the patient portion she plans to contact CSW to schedule a time to review and submit applications.  CSW provided contact information and encouraged patient to call with questions or concerns.    Teresa Hayes, MSW, LCSW, OSW-C Clinical Social Worker [facility] [phone]

## 2019-09-14 NOTE — Telephone Encounter (Signed)
Spoke with patient to confirm we can cancel upcoming CT scan, patient had already canceled, patient also confirmed Social work did try to make contact but they are playing phone tag, patient confirmed she will keep appointment for MRI and explained she is having financial hardship and that her insurance is about to change, all should be well next year.

## 2019-09-15 ENCOUNTER — Telehealth: Payer: Self-pay | Admitting: Licensed Clinical Social Worker

## 2019-09-15 ENCOUNTER — Encounter: Payer: Self-pay | Admitting: *Deleted

## 2019-09-15 ENCOUNTER — Telehealth: Payer: Self-pay | Admitting: *Deleted

## 2019-09-15 ENCOUNTER — Ambulatory Visit: Payer: Self-pay | Admitting: Licensed Clinical Social Worker

## 2019-09-15 ENCOUNTER — Encounter: Payer: Self-pay | Admitting: Licensed Clinical Social Worker

## 2019-09-15 DIAGNOSIS — C50512 Malignant neoplasm of lower-outer quadrant of left female breast: Secondary | ICD-10-CM

## 2019-09-15 DIAGNOSIS — Z1379 Encounter for other screening for genetic and chromosomal anomalies: Secondary | ICD-10-CM | POA: Insufficient documentation

## 2019-09-15 DIAGNOSIS — Z800 Family history of malignant neoplasm of digestive organs: Secondary | ICD-10-CM

## 2019-09-15 NOTE — Telephone Encounter (Signed)
Revealed negative genetic testing. This normal result is reassuring and indicates that it is unlikely Teresa Hayes's cancer is due to a hereditary cause.  It is unlikely that there is an increased risk of another cancer due to a mutation in one of these genes.  However, genetic testing is not perfect, and cannot definitively rule out a hereditary cause.  It will be important for her to keep in contact with genetics to learn if any additional testing may be needed in the future.

## 2019-09-15 NOTE — Progress Notes (Signed)
HPI:  Ms. Areola was previously seen in the [facility] clinic due to a personal and family history of cancer and concerns regarding a hereditary predisposition to cancer. Please refer to our prior cancer genetics clinic note for more information regarding our discussion, assessment and recommendations, at the time. Ms. Mcmorris recent genetic test results were disclosed to her, as were recommendations warranted by these results. These results and recommendations are discussed in more detail below.  CANCER HISTORY:  Oncology History  Malignant neoplasm of lower-outer quadrant of left breast of female, estrogen receptor positive ([place])  08/24/2019 Initial Diagnosis   Malignant neoplasm of lower-outer quadrant of left breast of female, estrogen receptor positive ([facility])    Genetic Testing   Negative testing. No pathogenic variants identified on the [facility] STAT Breast Cancer Panel + Common Hereditary Cancers Panel. The STAT Breast cancer panel offered by [facility] includes sequencing and rearrangement analysis for the following 9 genes:  ATM, BRCA1, BRCA2, CDH1, CHEK2, PALB2, PTEN, STK11 and TP53.  The Common Hereditary Cancers Panel offered by [facility] includes sequencing and/or deletion duplication testing of the following 48 genes: APC, ATM, AXIN2, BARD1, BMPR1A, BRCA1, BRCA2, BRIP1, CDH1, CDKN2A (p14ARF), CDKN2A (p16INK4a), CKD4, CHEK2, CTNNA1, DICER1, EPCAM (Deletion/duplication testing only), GREM1 (promoter region deletion/duplication testing only), KIT, MEN1, MLH1, MSH2, MSH3, MSH6, MUTYH, NBN, NF1, NHTL1, PALB2, PDGFRA, PMS2, POLD1, POLE, PTEN, RAD50, RAD51C, RAD51D, RNF43, SDHB, SDHC, SDHD, SMAD4, SMARCA4. STK11, TP53, TSC1, TSC2, and VHL.  The following genes were evaluated for sequence changes only: SDHA and HOXB13 c.251G>A variant only. The report date is 09/14/2019.     FAMILY HISTORY:  We obtained a detailed, 4-generation family history.  Significant diagnoses are listed below:  Family History  Problem Relation Age of Onset  . Pancreatic cancer Maternal Grandmother        dx 86s  . Cancer Maternal Grandfather        unk type, possibly GI    Ms. Pangelinan has a 1 son and 1 daughter, no cancers. She has 1 brother, 1 sister, no cancers.   Ms. Rowand mother is living at 60, no history of cancer. Patient has 1 maternal uncle, 2 maternal aunts, no cancers. No known cancers in maternal cousins. She reports limited information about this side of the family. Maternal grandmother had pancreatic cancer in her 23s and died in her 60s. Maternal grandfather had cancer, she is unsure the type, possibly GI.   Ms. Twyman's father died at 55, no history of cancer. Patient had 1 paternal aunt, 3 paternal uncles no cancers. No paternal cousins. Paternal grandmother died at 7, paternal grandfather died in his 75s.   Ms. Hench is unaware of previous family history of genetic testing for hereditary cancer risks. Patient's maternal ancestors are of Irish/Scottish descent, and paternal ancestors are of [place] descent. There is no reported Ashkenazi Jewish ancestry. There is no known consanguinity.  GENETIC TEST RESULTS: Genetic testing reported out on 09/14/2019 through the [facility] Breast Cancer Panel + Common Hereditary cancer panel found no pathogenic mutations. The STAT Breast cancer panel offered by [facility] includes sequencing and rearrangement analysis for the following 9 genes:  ATM, BRCA1, BRCA2, CDH1, CHEK2, PALB2, PTEN, STK11 and TP53.  The Common Hereditary Cancers Panel offered by [facility] includes sequencing and/or deletion duplication testing of the following 48 genes: APC, ATM, AXIN2, BARD1, BMPR1A, BRCA1, BRCA2, BRIP1, CDH1, CDKN2A (p14ARF), CDKN2A (p16INK4a), CKD4, CHEK2, CTNNA1, DICER1, EPCAM (Deletion/duplication testing only), GREM1 (promoter region deletion/duplication testing only), KIT, MEN1,  MLH1, MSH2, MSH3, MSH6, MUTYH, NBN, NF1, NHTL1, PALB2, PDGFRA, PMS2, POLD1,  POLE, PTEN, RAD50, RAD51C, RAD51D, RNF43, SDHB, SDHC, SDHD, SMAD4, SMARCA4. STK11, TP53, TSC1, TSC2, and VHL.  The following genes were evaluated for sequence changes only: SDHA and HOXB13 c.251G>A variant only. The test report has been scanned into EPIC and is located under the Molecular Pathology section of the Results Review tab.  A portion of the result report is included below for reference.     We discussed with Ms. Goodreau that because current genetic testing is not perfect, it is possible there may be a gene mutation in one of these genes that current testing cannot detect, but that chance is small.  We also discussed, that there could be another gene that has not yet been discovered, or that we have not yet tested, that is responsible for the cancer diagnoses in the family. It is also possible there is a hereditary cause for the cancer in the family that Ms. Gault did not inherit and therefore was not identified in her testing.  Therefore, it is important to remain in touch with cancer genetics in the future so that we can continue to offer Ms. Longman the most up to date genetic testing.   ADDITIONAL GENETIC TESTING: We discussed with Ms. Pequignot that her genetic testing was fairly extensive.  If there are genes identified to increase cancer risk that can be analyzed in the future, we would be happy to discuss and coordinate this testing at that time.    CANCER SCREENING RECOMMENDATIONS: Ms. Mandarino test result is considered negative (normal).  This means that we have not identified a hereditary cause for her  personal and family history of cancer at this time. Most cancers happen by chance and this negative test suggests that her cancer may fall into this category.    While reassuring, this does not definitively rule out a hereditary predisposition to cancer. It is still possible that there could be genetic mutations that are undetectable by current technology. There could be genetic  mutations in genes that have not been tested or identified to increase cancer risk.  Therefore, it is recommended she continue to follow the cancer management and screening guidelines provided by her oncology and primary healthcare provider.   An individual's cancer risk and medical management are not determined by genetic test results alone. Overall cancer risk assessment incorporates additional factors, including personal medical history, family history, and any available genetic information that may result in a personalized plan for cancer prevention and surveillance.  RECOMMENDATIONS FOR FAMILY MEMBERS:  Relatives in this family might be at some increased risk of developing cancer, over the general population risk, simply due to the family history of cancer.  We recommended female relatives in this family have a yearly mammogram beginning at age 76, or 38 years younger than the earliest onset of cancer, an annual clinical breast exam, and perform monthly breast self-exams. Female relatives in this family should also have a gynecological exam as recommended by their primary provider. All family members should have a colonoscopy by age 20, or as directed by their physicians.   It is also possible there is a hereditary cause for the cancer in Ms. Bleier's family that she did not inherit and therefore was not identified in her.  Based on Ms. Giannone's family history, we recommended her maternal relatives have genetic counseling and testing. Ms. Toothaker will let us know if we can be of any assistance in coordinating genetic  counseling and/or testing for these family members.  FOLLOW-UP: Lastly, we discussed with Ms. Lafrance that cancer genetics is a rapidly advancing field and it is possible that new genetic tests will be appropriate for her and/or her family members in the future. We encouraged her to remain in contact with cancer genetics on an annual basis so we can update her personal and family  histories and let her know of advances in cancer genetics that may benefit this family.   Our contact number was provided. Ms. Franckowiak questions were answered to her satisfaction, and she knows she is welcome to call us at anytime with additional questions or concerns.   Faith Rogue, MS, [facility] Genetic Counselor [email or web address] Phone: [phone]

## 2019-09-15 NOTE — Telephone Encounter (Signed)
-----   Message from [name], DO sent at 09/14/2019 10:45 PM EDT ----- Normal labs, please call patient and send results to pcp.  Continue with current treatment plan.

## 2019-09-15 NOTE — Telephone Encounter (Signed)
Spoke with patient to follow up from [facility] and assess navigation needs.  No needs at this time.

## 2019-09-15 NOTE — Telephone Encounter (Signed)
Telephone call to patient. Left message that labs were normal and to call with any questions. 

## 2019-09-19 ENCOUNTER — Encounter (HOSPITAL_COMMUNITY): Payer: 59

## 2019-09-19 NOTE — Progress Notes (Signed)
[facility] Psychosocial Distress Screening Clinical Social Work  Clinical Social Work was referred by distress screening protocol.  The patient scored a 10 on the Psychosocial Distress Thermometer which indicates severe distress. Counseling intern contacted patient by phone to assess for distress and other psychosocial needs.   ONCBCN DISTRESS SCREENING 09/19/2019  Distress experienced in past week (1-10) 10  Practical problem type Insurance  Emotional problem type Depression;Nervousness/Anxiety;Adjusting to illness;Isolation/feeling alone;Feeling hopeless  Spiritual/Religous concerns type Facing my mortality  Information Concerns Type Lack of info about treatment  Physical Problem type Pain;Sleep/insomnia;Getting around;Breathing;Swollen arms/legs  Referral to support programs Yes   Clinical Social Worker follow up needed: No.  Counseling Intern Notes:  Spoke to pt via phone on 09/19/2019. Pt stated initially when she learned of her cancer dx she was extremely distressed and "threw herself a pitty party" where she cried on and off for several weeks. Pt reported a history of SI as a teenager, but she denied any recent suicidal thoughts. Counselor will work with pt to develop a Chief Strategy Officer. Pt stated that finding out about her tx plan has decreased her distress a bit, but she is still nervous and scared. Pt states she lives alone and has very limited supports. Pt reports she has only female friends and feels they will not understand what she is going through. Additionally, she stated that she knows no one who has gone through breast cancer and agreed that getting a [facility] mentor would be beneficial. Counselor will refer the pt to the mentor match program through [place]. Pt is interested in engaging in [facility] counseling services.  Counseling appt set up for next week (09/26/2019 at 10:00am via phone). Pt reports her goals for counseling are to feel more emotionally stable and feel understood.    Art Buff [place]  Counseling Intern Voicemail:  [phone]

## 2019-09-20 ENCOUNTER — Ambulatory Visit (HOSPITAL_COMMUNITY): Payer: 59

## 2019-09-21 ENCOUNTER — Other Ambulatory Visit (HOSPITAL_COMMUNITY): Payer: 59

## 2019-09-22 ENCOUNTER — Other Ambulatory Visit: Payer: 59

## 2019-09-23 ENCOUNTER — Telehealth: Payer: Self-pay | Admitting: *Deleted

## 2019-09-23 NOTE — Telephone Encounter (Signed)
Left message for a return phone call to see why MRI is now 12/8.

## 2019-09-26 NOTE — Progress Notes (Signed)
Counseling Notes: Spoke to patient via phone today for a counseling appointment. Counselor provided therapeutic listening and emotional support. Pt reported excitement about being matched with a mentor through [employer] and is looking forward to talking to someone who has "been there". Pt reported that she is experiencing fear, anxiety, isolation, and uncertainty related to her cancer. Pt has concerns and questions about how her body will look after surgery. Pt stated it is helpful to have someone to talk to about what she is going through and wants to continue with weekly counseling sessions.     Next Counseling Appt: 10/03/2019 at 10:00am via phone  Art Buff [facility] Counseling Intern Voicemail:  [phone]

## 2019-09-27 ENCOUNTER — Telehealth: Payer: Self-pay | Admitting: *Deleted

## 2019-09-27 NOTE — Telephone Encounter (Signed)
Called patient and LVM to see if she was still interested in the [facility] study and if she received the consent forms mailed to her.

## 2019-09-27 NOTE — Telephone Encounter (Signed)
Spoke with patient.  She has delayed all treatment till after December 1.  She will have new insurance starting 12/1 and would like everything done after that date.  She is scheduled for MRI 12/8. Will notify team.

## 2019-10-03 NOTE — Progress Notes (Signed)
Counseling Intern Notes:  Spoke to patient on 10/03/2019 via phone for a counseling session.  Counseling Intern provided space for pt to process emotions and recent experiences. Pt reported anxiety related to her cancer diagnosis and has worries related to the out of pocket cost of treatment. Pt also reported a history of social anxiety. Pt reported taking control of things in her life that she can right now. Counseling intern recognized pt strengths, which include independence, self-awareness, self-motivation, and assertiveness.  Next Counseling Appt:  10/10/2019 at 10:00am  [facility] Counseling Intern Voicemail:  [phone]

## 2019-10-06 ENCOUNTER — Telehealth: Payer: Self-pay | Admitting: Cardiology

## 2019-10-06 NOTE — Telephone Encounter (Signed)
Patient called in reporting she was having dizziness and lightheadedness that started several hours ago. When symptoms started she took 2 SL nitro and has had worsening dizziness. Unable to check her blood pressure as she has no cuff. Normally takes her blood pressure medications in the evening and has not taken yet. No chest pain. I reviewed the indications for nitro and advised she should avoid if dizzy or lightheaded in the future. Advised to hold her blood pressure medications for the evening given her symptoms. She would like to check her blood pressure but has large arms and unable to find a cuff that fits. Asked if the office does BP checks. She will call the office in the morning for a BP check as her meds were recently adjusted. Need to see if she is tolerating the adjustment. She was agreeable to this plan and thanked me for the follow up call.

## 2019-10-07 ENCOUNTER — Encounter: Payer: Self-pay | Admitting: Cardiology

## 2019-10-07 ENCOUNTER — Ambulatory Visit (INDEPENDENT_AMBULATORY_CARE_PROVIDER_SITE_OTHER): Payer: 59

## 2019-10-07 ENCOUNTER — Other Ambulatory Visit: Payer: Self-pay

## 2019-10-07 ENCOUNTER — Telehealth: Payer: Self-pay | Admitting: Cardiology

## 2019-10-07 ENCOUNTER — Ambulatory Visit (INDEPENDENT_AMBULATORY_CARE_PROVIDER_SITE_OTHER): Payer: 59 | Admitting: Cardiology

## 2019-10-07 VITALS — BP 158/92 | HR 101 | Ht 66.0 in | Wt 368.0 lb

## 2019-10-07 DIAGNOSIS — Z01812 Encounter for preprocedural laboratory examination: Secondary | ICD-10-CM | POA: Diagnosis not present

## 2019-10-07 DIAGNOSIS — R002 Palpitations: Secondary | ICD-10-CM

## 2019-10-07 DIAGNOSIS — R072 Precordial pain: Secondary | ICD-10-CM | POA: Diagnosis not present

## 2019-10-07 MED ORDER — METOPROLOL SUCCINATE ER 25 MG PO TB24: 12.5000 mg | ORAL_TABLET | Freq: Every day | ORAL | 1 refills | Status: DC

## 2019-10-07 MED ORDER — ISOSORBIDE MONONITRATE ER 30 MG PO TB24: 30.0000 mg | ORAL_TABLET | Freq: Every day | ORAL | 1 refills | Status: DC

## 2019-10-07 NOTE — Progress Notes (Signed)
Cardiology Office Note:    Date:  10/07/2019   ID:  Teresa, Hayes 1965-11-28, MRN [id number]  PCP:  Algis Greenhouse, MD  Cardiologist:  No primary care provider on file.  Electrophysiologist:  None   Referring MD: Algis Greenhouse, MD   Follow up visit.  History of Present Illness:    Teresa Hayes is a 53 y.o. 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  Patient is planning surgery (lumpectomy), and radiation.  The patient was seen on 09/12/2019 at that time she reported chest pain that was concerning for angina symptoms. At the conclusion of her visit, I recommend a pharmacologic nuclear stress test and an echocardiogram. In addition nitroglycerin SL was ordered and the patient was educated on how to use this medication. The patient testing is pending.   Today the patient called to ask for an appointment to see me because she had significant chest pain yesterday.  She describes that pain as a midsternum burning sensation with no radiation which required her to take 2 nitroglycerin pills.  She notes that the pain was so severe that she almost went to the emergency department to be evaluated. The patient tells me that this time her chest pain was associated with palpitations. She states that the palpitation was a rapid onset of fast heart beat which last for a few seconds prior to resolution.   Past Medical History:  Diagnosis Date  . Arthritis    knees  . Family history of pancreatic cancer   . Hypertension   . Lymph edema    idiopathic  . Malignant neoplasm of lower-outer quadrant of left breast of female, estrogen receptor positive ([place]) 08/24/2019  . Peripheral vascular disease (HCC)    lymphedema - right leg - wears comp hose  . SVD (spontaneous vaginal delivery) x 2    Past Surgical History:  Procedure Laterality Date  . CHOLECYSTECTOMY    .  HYSTEROSCOPY W/D&C  10/14/2012   Procedure: DILATATION AND CURETTAGE /HYSTEROSCOPY;  Surgeon: Thurnell Lose, MD;  Location: [facility];  Service: Gynecology;  Laterality: N/A;  wants ultrasound guidance  ask scheduler to remember to tell surgeon to put order in  . KNEE SURGERY Right     Current Medications: No outpatient medications have been marked as taking for the 10/07/19 encounter (Office Visit) with [name], DO.     Allergies:   Ultram [tramadol], Topiramate, and Shellfish allergy   Social History   Socioeconomic History  . Marital status: Single    Spouse name: Not on file  . Number of children: Not on file  . Years of education: Not on file  . Highest education level: Not on file  Occupational History  . Not on file  Social Needs  . Financial resource strain: Not on file  . Food insecurity    Worry: Not on file    Inability: Not on file  . Transportation needs    Medical: Not on file    Non-medical: Not on file  Tobacco Use  . Smoking status: Never Smoker  . Smokeless tobacco: Never Used  Substance and Sexual Activity  . Alcohol use: Yes    Alcohol/week: 0.0 standard drinks    Comment: rarely  . Drug use: No  . Sexual activity: Not Currently    Birth control/protection: None  Lifestyle  . Physical activity    Days per  week: Not on file    Minutes per session: Not on file  . Stress: Not on file  Relationships  . Social Herbalist on phone: Not on file    Gets together: Not on file    Attends religious service: Not on file    Active member of club or organization: Not on file    Attends meetings of clubs or organizations: Not on file    Relationship status: Not on file  Other Topics Concern  . Not on file  Social History Narrative  . Not on file     Family History: The patient's family history includes Cancer in her maternal grandfather; Pancreatic cancer in her maternal grandmother.  ROS:   Review of Systems  Constitution: Negative for  decreased appetite, fever and weight gain.  HENT: Negative for congestion, ear discharge, hoarse voice and sore throat.   Eyes: Negative for discharge, redness, vision loss in right eye and visual halos.  Cardiovascular:Reports chest pain. Negative for dyspnea on exertion, leg swelling, orthopnea and palpitations.  Respiratory: Negative for cough, hemoptysis, shortness of breath and snoring.   Endocrine: Negative for heat intolerance and polyphagia.  Hematologic/Lymphatic: Negative for bleeding problem. Does not bruise/bleed easily.  Skin: Negative for flushing, nail changes, rash and suspicious lesions.  Musculoskeletal: Negative for arthritis, joint pain, muscle cramps, myalgias, neck pain and stiffness.  Gastrointestinal: Negative for abdominal pain, bowel incontinence, diarrhea and excessive appetite.  Genitourinary: Negative for decreased libido, genital sores and incomplete emptying.  Neurological: Negative for brief paralysis, focal weakness, headaches and loss of balance.  Psychiatric/Behavioral: Negative for altered mental status, depression and suicidal ideas.  Allergic/Immunologic: Negative for HIV exposure and persistent infections.    EKGs/Labs/Other Studies Reviewed:    The following studies were reviewed today:   EKG:  None today.  Recent Labs: 09/07/2019: ALT 14; Hemoglobin 13.3; Platelet Count 272 09/12/2019: BUN 13; Creatinine, Ser 0.83; Magnesium 2.2; Potassium 4.3; Sodium 137  Recent Lipid Panel No results found for: CHOL, TRIG, HDL, CHOLHDL, VLDL, LDLCALC, LDLDIRECT  Physical Exam:    VS:  Ht '5\' 6"'$  (1.676 m)   Wt (!) 368 lb (166.9 kg)   LMP 06/02/2015 (Exact Date)   BMI 59.40 kg/m     Wt Readings from Last 3 Encounters:  10/07/19 (!) 368 lb (166.9 kg)  09/12/19 (!) 376 lb (170.6 kg)  09/07/19 (!) 370 lb 8 oz (168.1 kg)     GEN: Well nourished obese female, well developed in no acute distress HEENT: Normal NECK: No JVD; No carotid bruits LYMPHATICS:  No lymphadenopathy CARDIAC: S1S2 noted,RRR, no murmurs, rubs, gallops RESPIRATORY:  Clear to auscultation without rales, wheezing or rhonchi  ABDOMEN: Soft, non-tender, non-distended, +bowel sounds, no guarding. EXTREMITIES: No edema, No cyanosis, no clubbing MUSCULOSKELETAL:  No edema; No deformity  SKIN: Warm and dry NEUROLOGIC:  Alert and oriented x 3, non-focal PSYCHIATRIC:  Normal affect, good insight  ASSESSMENT:    1. Palpitations   2. Precordial pain   3. Pre-procedure lab exam    PLAN:    1. Chest pain - during her initial evaluation a pharmacologic nuclear stress test was ordered. Today during our visit, she prefers any other testing for ischemic evaluation other than the nuclear stress test. The patient't request is not unreasonable therefore we will cancel the pharmacologic nuclear stress test. I will order the CTA coronaries. The patient was educated about this testing, she has had previous CT with IV contrast. I will start her  on long acting nitrate; imdur '30mg'$  daily.   2. For the palpitation, I will place a zio monitor on the patient for 5 days. Her transthoracic echocardiogram is pending. Toprol xl 12.'5mg'$  daily will be started.   3. Hypertension, her blood pressure elevated today in the office. We will continue the patient on her lisinopril 40 mg daily, HCTZ 12.'5mg'$  daily. Her newly prescribed medications stated above will also help with her blood pressure.  5. Morbid obesity - continue to encourage weight loss.  The patient is in agreement with the above plan. The patient left the office in stable condition.  The patient will follow up in   Medication Adjustments/Labs and Tests Ordered: Current medicines are reviewed at length with the patient today.  Concerns regarding medicines are outlined above.  Orders Placed This Encounter  Procedures  . CT CORONARY FRACTIONAL FLOW RESERVE DATA PREP  . CT CORONARY FRACTIONAL FLOW RESERVE FLUID ANALYSIS  . CT CORONARY MORPH  W/CTA COR W/SCORE W/CA W/CM &/OR WO/CM  . Basic Metabolic Panel (BMET)  . LONG TERM MONITOR (3-14 DAYS)   Meds ordered this encounter  Medications  . metoprolol succinate (TOPROL XL) 25 MG 24 hr tablet    Sig: Take 0.5 tablets (12.5 mg total) by mouth daily.    Dispense:  45 tablet    Refill:  1  . isosorbide mononitrate (IMDUR) 30 MG 24 hr tablet    Sig: Take 1 tablet (30 mg total) by mouth daily.    Dispense:  90 tablet    Refill:  1    Patient Instructions  Medication Instructions:  Your physician has recommended you make the following change in your medication:  START: Toprol XL(metoprolol XL) 25 mg Take 1/2 tab daily START: Imdur (Isosorbide) '30mg'$  Take 1 tab daily  *If you need a refill on your cardiac medications before your next appointment, please call your pharmacy*  Lab Work: Your physician recommends that you return for lab work in:   3-7 days prior to CT: BMP  If you have labs (blood work) drawn today and your tests are completely normal, you will receive your results only by: Marland Kitchen MyChart Message (if you have MyChart) OR . A paper copy in the mail If you have any lab test that is abnormal or we need to change your treatment, we will call you to review the results.  Testing/Procedures: Your physician has requested that you have cardiac CT. Cardiac computed tomography (CT) is a painless test that uses an x-ray machine to take clear, detailed pictures of your heart. For further information please visit [email or web address]. Please follow instruction sheet as given.  Your cardiac CT will be scheduled at one of the below locations:   [facility] [address] [place], [place] [address] [phone]  If scheduled at [facility], please arrive at the [facility] main entrance of [facility] 30-45 minutes prior to test start time. Proceed to the [facility] Radiology Department (first floor) to check-in and test prep.   Please follow  these instructions carefully (unless otherwise directed):  On the Night Before the Test: . Be sure to Drink plenty of water. . Do not consume any caffeinated/decaffeinated beverages or chocolate 12 hours prior to your test. . Do not take any antihistamines 12 hours prior to your test.  On the Day of the Test: . Drink plenty of water. Do not drink any water within one hour of the test. . Do not eat any food  4 hours prior to the test. . You may take your regular medications prior to the test.  . Take metoprolol (Toprol XL) two hours prior to test. . FEMALES- please wear underwire-free bra if available  After the Test: . Drink plenty of water. . After receiving IV contrast, you may experience a mild flushed feeling. This is normal. . On occasion, you may experience a mild rash up to 24 hours after the test. This is not dangerous. If this occurs, you can take Benadryl 25 mg and increase your fluid intake. . If you experience trouble breathing, this can be serious. If it is severe call 911 IMMEDIATELY. If it is mild, please call our office.   Once we have confirmed authorization from your insurance company, we will call you to set up a date and time for your test.   For non-scheduling related questions, please contact the cardiac imaging nurse navigator should you have any questions/concerns: Marchia Bond, RN Navigator Cardiac Imaging [facility] [phone] Office   A zio monitor was placed today. It will remain on for 5 days. You will then return monitor and event diary in provided box. It takes 1-2 weeks for report to be downloaded and returned to [place]. We will call you with the results. If monitor falls off or has orange flashing light, please call Zio for further instructions.    Follow-Up: At [facility], you and your health needs are our priority.  As part of our continuing mission to provide you with exceptional heart care, we have created designated  Provider Care Teams.  These Care Teams include your primary Cardiologist (physician) and Advanced Practice Providers (APPs -  Physician Assistants and Nurse Practitioners) who all work together to provide you with the care you need, when you need it.  Your next appointment:   1 month  The format for your next appointment:   In Person  Provider:   [name], DO  Other Instructions Isosorbide Mononitrate extended-release tablets What is this medicine? ISOSORBIDE MONONITRATE (eye soe SOR bide mon oh NYE trate) is a vasodilator. It relaxes blood vessels, increasing the blood and oxygen supply to your heart. This medicine is used to prevent chest pain caused by angina. It will not help to stop an episode of chest pain. This medicine may be used for other purposes; ask your health care provider or pharmacist if you have questions. COMMON BRAND NAME(S): Imdur, Isotrate ER What should I tell my health care provider before I take this medicine? They need to know if you have any of these conditions:  previous heart attack or heart failure  an unusual or allergic reaction to isosorbide mononitrate, nitrates, other medicines, foods, dyes, or preservatives  pregnant or trying to get pregnant  breast-feeding How should I use this medicine? Take this medicine by mouth with a glass of water. Follow the directions on the prescription label. Do not crush or chew. Take your medicine at regular intervals. Do not take your medicine more often than directed. Do not stop taking this medicine except on the advice of your doctor or health care professional. Talk to your pediatrician regarding the use of this medicine in children. Special care may be needed. Overdosage: If you think you have taken too much of this medicine contact a poison control center or emergency room at once. NOTE: This medicine is only for you. Do not share this medicine with others. What if I miss a dose? If you miss a  dose, take it  as soon as you can. If it is almost time for your next dose, take only that dose. Do not take double or extra doses. What may interact with this medicine? Do not take this medicine with any of the following medications:  medicines used to treat erectile dysfunction (ED) like avanafil, sildenafil, tadalafil, and vardenafil  riociguat This medicine may also interact with the following medications:  medicines for high blood pressure  other medicines for angina or heart failure This list may not describe all possible interactions. Give your health care provider a list of all the medicines, herbs, non-prescription drugs, or dietary supplements you use. Also tell them if you smoke, drink alcohol, or use illegal drugs. Some items may interact with your medicine. What should I watch for while using this medicine? Check your heart rate and blood pressure regularly while you are taking this medicine. Ask your doctor or health care professional what your heart rate and blood pressure should be and when you should contact him or her. Tell your doctor or health care professional if you feel your medicine is no longer working. You may get dizzy. Do not drive, use machinery, or do anything that needs mental alertness until you know how this medicine affects you. To reduce the risk of dizzy or fainting spells, do not sit or stand up quickly, especially if you are an older patient. Alcohol can make you more dizzy, and increase flushing and rapid heartbeats. Avoid alcoholic drinks. Do not treat yourself for coughs, colds, or pain while you are taking this medicine without asking your doctor or health care professional for advice. Some ingredients may increase your blood pressure. What side effects may I notice from receiving this medicine? Side effects that you should report to your doctor or health care professional as soon as possible:  bluish discoloration of lips, fingernails, or palms of hands  irregular  heartbeat, palpitations  low blood pressure  nausea, vomiting  persistent headache  unusually weak or tired Side effects that usually do not require medical attention (report to your doctor or health care professional if they continue or are bothersome):  flushing of the face or neck  rash This list may not describe all possible side effects. Call your doctor for medical advice about side effects. You may report side effects to FDA at 1-800-FDA-1088. Where should I keep my medicine? Keep out of the reach of children. Store between 15 and 30 degrees C (59 and 86 degrees F). Keep container tightly closed. Throw away any unused medicine after the expiration date. NOTE: This sheet is a summary. It may not cover all possible information. If you have questions about this medicine, talk to your doctor, pharmacist, or health care provider.  2020 Elsevier/Gold Standard (2013-09-09 14:48:19) Metoprolol extended-release tablets What is this medicine? METOPROLOL (me TOE proe lole) is a beta-blocker. Beta-blockers reduce the workload on the heart and help it to beat more regularly. This medicine is used to treat high blood pressure and to prevent chest pain. It is also used to after a heart attack and to prevent an additional heart attack from occurring. This medicine may be used for other purposes; ask your health care provider or pharmacist if you have questions. COMMON BRAND NAME(S): toprol, Toprol XL What should I tell my health care provider before I take this medicine? They need to know if you have any of these conditions:  diabetes  heart or vessel disease like slow heart rate, worsening heart  failure, heart block, sick sinus syndrome or Raynaud's disease  kidney disease  liver disease  lung or breathing disease, like asthma or emphysema  pheochromocytoma  thyroid disease  an unusual or allergic reaction to metoprolol, other beta-blockers, medicines, foods, dyes, or preservatives   pregnant or trying to get pregnant  breast-feeding How should I use this medicine? Take this medicine by mouth with a glass of water. Follow the directions on the prescription label. Do not crush or chew. Take this medicine with or immediately after meals. Take your doses at regular intervals. Do not take more medicine than directed. Do not stop taking this medicine suddenly. This could lead to serious heart-related effects. Talk to your pediatrician regarding the use of this medicine in children. While this drug may be prescribed for children as young as 6 years for selected conditions, precautions do apply. Overdosage: If you think you have taken too much of this medicine contact a poison control center or emergency room at once. NOTE: This medicine is only for you. Do not share this medicine with others. What if I miss a dose? If you miss a dose, take it as soon as you can. If it is almost time for your next dose, take only that dose. Do not take double or extra doses. What may interact with this medicine? This medicine may interact with the following medications:  certain medicines for blood pressure, heart disease, irregular heart beat  certain medicines for depression, like monoamine oxidase (MAO) inhibitors, fluoxetine, or paroxetine  clonidine  dobutamine  epinephrine  isoproterenol  reserpine This list may not describe all possible interactions. Give your health care provider a list of all the medicines, herbs, non-prescription drugs, or dietary supplements you use. Also tell them if you smoke, drink alcohol, or use illegal drugs. Some items may interact with your medicine. What should I watch for while using this medicine? Visit your doctor or health care professional for regular check ups. Contact your doctor right away if your symptoms worsen. Check your blood pressure and pulse rate regularly. Ask your health care professional what your blood pressure and pulse rate should  be, and when you should contact them. You may get drowsy or dizzy. Do not drive, use machinery, or do anything that needs mental alertness until you know how this medicine affects you. Do not sit or stand up quickly, especially if you are an older patient. This reduces the risk of dizzy or fainting spells. Contact your doctor if these symptoms continue. Alcohol may interfere with the effect of this medicine. Avoid alcoholic drinks. This medicine may increase blood sugar. Ask your healthcare provider if changes in diet or medicines are needed if you have diabetes. What side effects may I notice from receiving this medicine? Side effects that you should report to your doctor or health care professional as soon as possible:  allergic reactions like skin rash, itching or hives  cold or numb hands or feet  depression  difficulty breathing  faint  fever with sore throat  irregular heartbeat, chest pain  rapid weight gain   signs and symptoms of high blood sugar such as being more thirsty or hungry or having to urinate more than normal. You may also feel very tired or have blurry vision.  swollen legs or ankles Side effects that usually do not require medical attention (report to your doctor or health care professional if they continue or are bothersome):  anxiety or nervousness  change in sex drive or performance  dry skin  headache  nightmares or trouble sleeping  short term memory loss  stomach upset or diarrhea This list may not describe all possible side effects. Call your doctor for medical advice about side effects. You may report side effects to FDA at 1-800-FDA-1088. Where should I keep my medicine? Keep out of the reach of children. Store at room temperature between 15 and 30 degrees C (59 and 86 degrees F). Throw away any unused medicine after the expiration date. NOTE: This sheet is a summary. It may not cover all possible information. If you have questions about  this medicine, talk to your doctor, pharmacist, or health care provider.  2020 Elsevier/Gold Standard (2018-08-31 11:09:41)     Adopting a Healthy Lifestyle.  Know what a healthy weight is for you (roughly BMI <25) and aim to maintain this   Aim for 7+ servings of fruits and vegetables daily   65-80+ fluid ounces of water or unsweet tea for healthy kidneys   Limit to max 1 drink of alcohol per day; avoid smoking/tobacco   Limit animal fats in diet for cholesterol and heart health - choose grass fed whenever available   Avoid highly processed foods, and foods high in saturated/trans fats   Aim for low stress - take time to unwind and care for your mental health   Aim for 150 min of moderate intensity exercise weekly for heart health, and weights twice weekly for bone health   Aim for 7-9 hours of sleep daily   When it comes to diets, agreement about the perfect plan isnt easy to find, even among the experts. Experts at the [employer] developed an idea known as the Healthy Eating Plate. Just imagine a plate divided into logical, healthy portions.   The emphasis is on diet quality:   Load up on vegetables and fruits - one-half of your plate: Aim for color and variety, and remember that potatoes dont count.   Go for whole grains - one-quarter of your plate: Whole wheat, barley, wheat berries, quinoa, oats, brown rice, and foods made with them. If you want pasta, go with whole wheat pasta.   Protein power - one-quarter of your plate: Fish, chicken, beans, and nuts are all healthy, versatile protein sources. Limit red meat.   The diet, however, does go beyond the plate, offering a few other suggestions.   Use healthy plant oils, such as olive, canola, soy, corn, sunflower and peanut. Check the labels, and avoid partially hydrogenated oil, which have unhealthy trans fats.   If youre thirsty, drink water. Coffee and tea are good in moderation, but skip sugary  drinks and limit milk and dairy products to one or two daily servings.   The type of carbohydrate in the diet is more important than the amount. Some sources of carbohydrates, such as vegetables, fruits, whole grains, and beans-are healthier than others.   Finally, stay active  Signed, [name], DO  10/07/2019 8:49 PM    [facility]

## 2019-10-07 NOTE — Telephone Encounter (Signed)
Patient states that her BP meds are making her have episodes of dizziness and heart racing and has really gotten worse.Marland Kitchen Please  Call her she wants to be seen today if possible.

## 2019-10-07 NOTE — Patient Instructions (Addendum)
Medication Instructions:  Your physician has recommended you make the following change in your medication:  START: Toprol XL(metoprolol XL) 25 mg Take 1/2 tab daily START: Imdur (Isosorbide) 30mg  Take 1 tab daily  *If you need a refill on your cardiac medications before your next appointment, please call your pharmacy*  Lab Work: Your physician recommends that you return for lab work in:   3-7 days prior to CT: BMP  If you have labs (blood work) drawn today and your tests are completely normal, you will receive your results only by: Marland Kitchen MyChart Message (if you have MyChart) OR . A paper copy in the mail If you have any lab test that is abnormal or we need to change your treatment, we will call you to review the results.  Testing/Procedures: Your physician has requested that you have cardiac CT. Cardiac computed tomography (CT) is a painless test that uses an x-ray machine to take clear, detailed pictures of your heart. For further information please visit [email or web address]. Please follow instruction sheet as given.  Your cardiac CT will be scheduled at one of the below locations:   [facility] [address] [place], [place] [address] [phone]  If scheduled at [facility], please arrive at the [facility] main entrance of [facility] 30-45 minutes prior to test start time. Proceed to the [facility] Radiology Department (first floor) to check-in and test prep.   Please follow these instructions carefully (unless otherwise directed):  On the Night Before the Test: . Be sure to Drink plenty of water. . Do not consume any caffeinated/decaffeinated beverages or chocolate 12 hours prior to your test. . Do not take any antihistamines 12 hours prior to your test.  On the Day of the Test: . Drink plenty of water. Do not drink any water within one hour of the test. . Do not eat any food 4 hours prior to the test. . You may take your regular medications  prior to the test.  . Take metoprolol (Toprol XL) two hours prior to test. . FEMALES- please wear underwire-free bra if available  After the Test: . Drink plenty of water. . After receiving IV contrast, you may experience a mild flushed feeling. This is normal. . On occasion, you may experience a mild rash up to 24 hours after the test. This is not dangerous. If this occurs, you can take Benadryl 25 mg and increase your fluid intake. . If you experience trouble breathing, this can be serious. If it is severe call 911 IMMEDIATELY. If it is mild, please call our office.   Once we have confirmed authorization from your insurance company, we will call you to set up a date and time for your test.   For non-scheduling related questions, please contact the cardiac imaging nurse navigator should you have any questions/concerns: Marchia Bond, RN Navigator Cardiac Imaging [facility] [phone] Office   A zio monitor was placed today. It will remain on for 5 days. You will then return monitor and event diary in provided box. It takes 1-2 weeks for report to be downloaded and returned to [place]. We will call you with the results. If monitor falls off or has orange flashing light, please call Zio for further instructions.    Follow-Up: At [facility], you and your health needs are our priority.  As part of our continuing mission to provide you with exceptional heart care, we have created designated Provider Care Teams.  These Care Teams include your primary Cardiologist (physician) and Advanced Practice Providers (APPs -  Physician Assistants and Nurse Practitioners) who all work together to provide you with the care you need, when you need it.  Your next appointment:   1 month  The format for your next appointment:   In Person  Provider:   [name], DO  Other Instructions Isosorbide Mononitrate extended-release tablets What is this medicine? ISOSORBIDE  MONONITRATE (eye soe SOR bide mon oh NYE trate) is a vasodilator. It relaxes blood vessels, increasing the blood and oxygen supply to your heart. This medicine is used to prevent chest pain caused by angina. It will not help to stop an episode of chest pain. This medicine may be used for other purposes; ask your health care provider or pharmacist if you have questions. COMMON BRAND NAME(S): Imdur, Isotrate ER What should I tell my health care provider before I take this medicine? They need to know if you have any of these conditions:  previous heart attack or heart failure  an unusual or allergic reaction to isosorbide mononitrate, nitrates, other medicines, foods, dyes, or preservatives  pregnant or trying to get pregnant  breast-feeding How should I use this medicine? Take this medicine by mouth with a glass of water. Follow the directions on the prescription label. Do not crush or chew. Take your medicine at regular intervals. Do not take your medicine more often than directed. Do not stop taking this medicine except on the advice of your doctor or health care professional. Talk to your pediatrician regarding the use of this medicine in children. Special care may be needed. Overdosage: If you think you have taken too much of this medicine contact a poison control center or emergency room at once. NOTE: This medicine is only for you. Do not share this medicine with others. What if I miss a dose? If you miss a dose, take it as soon as you can. If it is almost time for your next dose, take only that dose. Do not take double or extra doses. What may interact with this medicine? Do not take this medicine with any of the following medications:  medicines used to treat erectile dysfunction (ED) like avanafil, sildenafil, tadalafil, and vardenafil  riociguat This medicine may also interact with the following medications:  medicines for high blood pressure  other medicines for angina or heart  failure This list may not describe all possible interactions. Give your health care provider a list of all the medicines, herbs, non-prescription drugs, or dietary supplements you use. Also tell them if you smoke, drink alcohol, or use illegal drugs. Some items may interact with your medicine. What should I watch for while using this medicine? Check your heart rate and blood pressure regularly while you are taking this medicine. Ask your doctor or health care professional what your heart rate and blood pressure should be and when you should contact him or her. Tell your doctor or health care professional if you feel your medicine is no longer working. You may get dizzy. Do not drive, use machinery, or do anything that needs mental alertness until you know how this medicine affects you. To reduce the risk of dizzy or fainting spells, do not sit or stand up quickly, especially if you are an older patient. Alcohol can make you more dizzy, and increase flushing and rapid heartbeats. Avoid alcoholic drinks. Do not treat yourself for coughs, colds, or pain while you are taking this medicine without asking your  doctor or health care professional for advice. Some ingredients may increase your blood pressure. What side effects may I notice from receiving this medicine? Side effects that you should report to your doctor or health care professional as soon as possible:  bluish discoloration of lips, fingernails, or palms of hands  irregular heartbeat, palpitations  low blood pressure  nausea, vomiting  persistent headache  unusually weak or tired Side effects that usually do not require medical attention (report to your doctor or health care professional if they continue or are bothersome):  flushing of the face or neck  rash This list may not describe all possible side effects. Call your doctor for medical advice about side effects. You may report side effects to FDA at 1-800-FDA-1088. Where should I  keep my medicine? Keep out of the reach of children. Store between 15 and 30 degrees C (59 and 86 degrees F). Keep container tightly closed. Throw away any unused medicine after the expiration date. NOTE: This sheet is a summary. It may not cover all possible information. If you have questions about this medicine, talk to your doctor, pharmacist, or health care provider.  2020 Elsevier/Gold Standard (2013-09-09 14:48:19) Metoprolol extended-release tablets What is this medicine? METOPROLOL (me TOE proe lole) is a beta-blocker. Beta-blockers reduce the workload on the heart and help it to beat more regularly. This medicine is used to treat high blood pressure and to prevent chest pain. It is also used to after a heart attack and to prevent an additional heart attack from occurring. This medicine may be used for other purposes; ask your health care provider or pharmacist if you have questions. COMMON BRAND NAME(S): toprol, Toprol XL What should I tell my health care provider before I take this medicine? They need to know if you have any of these conditions:  diabetes  heart or vessel disease like slow heart rate, worsening heart failure, heart block, sick sinus syndrome or Raynaud's disease  kidney disease  liver disease  lung or breathing disease, like asthma or emphysema  pheochromocytoma  thyroid disease  an unusual or allergic reaction to metoprolol, other beta-blockers, medicines, foods, dyes, or preservatives  pregnant or trying to get pregnant  breast-feeding How should I use this medicine? Take this medicine by mouth with a glass of water. Follow the directions on the prescription label. Do not crush or chew. Take this medicine with or immediately after meals. Take your doses at regular intervals. Do not take more medicine than directed. Do not stop taking this medicine suddenly. This could lead to serious heart-related effects. Talk to your pediatrician regarding the use of  this medicine in children. While this drug may be prescribed for children as young as 6 years for selected conditions, precautions do apply. Overdosage: If you think you have taken too much of this medicine contact a poison control center or emergency room at once. NOTE: This medicine is only for you. Do not share this medicine with others. What if I miss a dose? If you miss a dose, take it as soon as you can. If it is almost time for your next dose, take only that dose. Do not take double or extra doses. What may interact with this medicine? This medicine may interact with the following medications:  certain medicines for blood pressure, heart disease, irregular heart beat  certain medicines for depression, like monoamine oxidase (MAO) inhibitors, fluoxetine, or paroxetine  clonidine  dobutamine  epinephrine  isoproterenol  reserpine This list may not  describe all possible interactions. Give your health care provider a list of all the medicines, herbs, non-prescription drugs, or dietary supplements you use. Also tell them if you smoke, drink alcohol, or use illegal drugs. Some items may interact with your medicine. What should I watch for while using this medicine? Visit your doctor or health care professional for regular check ups. Contact your doctor right away if your symptoms worsen. Check your blood pressure and pulse rate regularly. Ask your health care professional what your blood pressure and pulse rate should be, and when you should contact them. You may get drowsy or dizzy. Do not drive, use machinery, or do anything that needs mental alertness until you know how this medicine affects you. Do not sit or stand up quickly, especially if you are an older patient. This reduces the risk of dizzy or fainting spells. Contact your doctor if these symptoms continue. Alcohol may interfere with the effect of this medicine. Avoid alcoholic drinks. This medicine may increase blood sugar. Ask  your healthcare provider if changes in diet or medicines are needed if you have diabetes. What side effects may I notice from receiving this medicine? Side effects that you should report to your doctor or health care professional as soon as possible:  allergic reactions like skin rash, itching or hives  cold or numb hands or feet  depression  difficulty breathing  faint  fever with sore throat  irregular heartbeat, chest pain  rapid weight gain   signs and symptoms of high blood sugar such as being more thirsty or hungry or having to urinate more than normal. You may also feel very tired or have blurry vision.  swollen legs or ankles Side effects that usually do not require medical attention (report to your doctor or health care professional if they continue or are bothersome):  anxiety or nervousness  change in sex drive or performance  dry skin  headache  nightmares or trouble sleeping  short term memory loss  stomach upset or diarrhea This list may not describe all possible side effects. Call your doctor for medical advice about side effects. You may report side effects to FDA at 1-800-FDA-1088. Where should I keep my medicine? Keep out of the reach of children. Store at room temperature between 15 and 30 degrees C (59 and 86 degrees F). Throw away any unused medicine after the expiration date. NOTE: This sheet is a summary. It may not cover all possible information. If you have questions about this medicine, talk to your doctor, pharmacist, or health care provider.  2020 Elsevier/Gold Standard (2018-08-31 11:09:41)

## 2019-10-07 NOTE — Telephone Encounter (Signed)
Patient has appointment today 10/07/19 at 1:15.

## 2019-10-10 NOTE — Progress Notes (Signed)
Counseling Intern Notes:  Spoke to patient on 10/10/2019 via phone for a counseling session.  Counseling Intern provided space for pt to process emotions and recent experiences. Pt reported stress and worry related to not knowing what is causing her heart related symptoms.  She reported that she is currently wearing a heart monitor per her cardiologist request.  Pt reports anxiety related to the uncertainty of what might be causing her symptoms.  Pt also stated she continues to have difficulty sleeping because of her side effects of the hormone medication.  Pt reported feeling isolated, but has new strategies to potentially reduce feelings of disconnection.  Pt stated she feels hopeful and that "a year from now it will all be better."  Next Counseling Appt:  10/24/2019 at 10:00am (via phone)  [facility] Counseling Intern Voicemail:  [phone]

## 2019-10-31 ENCOUNTER — Telehealth: Payer: Self-pay | Admitting: *Deleted

## 2019-10-31 NOTE — Telephone Encounter (Signed)
P.A. obtained and noted on MRI appointment

## 2019-10-31 NOTE — Telephone Encounter (Signed)
"  Alyce Pagan with [facility] Radiology [phone] ext. [phone]).  Need to speak with someone about prior authorization for tomorrow's bilat. MRI with and without contrast.  This needs to be authorized by 12:00 pm today."

## 2019-11-01 ENCOUNTER — Ambulatory Visit
Admission: RE | Admit: 2019-11-01 | Discharge: 2019-11-01 | Disposition: A | Discharge status: Home / Self Care | Payer: 59 | Source: Ambulatory Visit | Attending: Oncology | Admitting: Oncology

## 2019-11-01 ENCOUNTER — Other Ambulatory Visit: Payer: Self-pay

## 2019-11-01 DIAGNOSIS — Z170 Estrogen receptor positive status [ER+]: Secondary | ICD-10-CM

## 2019-11-02 ENCOUNTER — Telehealth: Payer: Self-pay | Admitting: *Deleted

## 2019-11-02 NOTE — Telephone Encounter (Signed)
Left vm for pt regarding cancellation of breast MRI. Request return call to get r/s. Contact information provided.

## 2019-11-08 ENCOUNTER — Ambulatory Visit: Payer: 59

## 2019-11-09 ENCOUNTER — Ambulatory Visit: Payer: 59 | Admitting: Cardiology

## 2019-11-09 ENCOUNTER — Ambulatory Visit: Payer: 59

## 2019-11-14 ENCOUNTER — Other Ambulatory Visit: Payer: Self-pay

## 2019-11-14 ENCOUNTER — Ambulatory Visit (INDEPENDENT_AMBULATORY_CARE_PROVIDER_SITE_OTHER): Payer: 59

## 2019-11-14 DIAGNOSIS — R079 Chest pain, unspecified: Secondary | ICD-10-CM | POA: Diagnosis not present

## 2019-11-14 NOTE — Progress Notes (Signed)
Complete echocardiogram has been performed.  Jimmy Dino Borntreger RDCS, RVT 

## 2019-11-21 ENCOUNTER — Ambulatory Visit: Payer: 59 | Admitting: Cardiology

## 2019-11-21 ENCOUNTER — Telehealth: Payer: Self-pay

## 2019-11-21 NOTE — Telephone Encounter (Signed)
New message    Patient is calling back for echo results before she will schedule the Cardiac ct test. Please call patient

## 2019-11-23 ENCOUNTER — Other Ambulatory Visit: Payer: Self-pay

## 2019-11-23 ENCOUNTER — Ambulatory Visit (INDEPENDENT_AMBULATORY_CARE_PROVIDER_SITE_OTHER): Payer: 59 | Admitting: Cardiology

## 2019-11-23 ENCOUNTER — Encounter: Payer: Self-pay | Admitting: Cardiology

## 2019-11-23 VITALS — BP 144/78 | HR 104 | Ht 66.0 in | Wt 369.0 lb

## 2019-11-23 DIAGNOSIS — R079 Chest pain, unspecified: Secondary | ICD-10-CM | POA: Diagnosis not present

## 2019-11-23 DIAGNOSIS — I10 Essential (primary) hypertension: Secondary | ICD-10-CM | POA: Diagnosis not present

## 2019-11-23 MED ORDER — METOPROLOL SUCCINATE ER 25 MG PO TB24: 12.5000 mg | ORAL_TABLET | Freq: Every day | ORAL | 1 refills | Status: DC

## 2019-11-23 NOTE — Patient Instructions (Signed)
Medication Instructions:  Your physician has recommended you make the following change in your medication:   RESTART: Toprol XL 25 mg Take 1/2 tab(12.5 mg) daily  *If you need a refill on your cardiac medications before your next appointment, please call your pharmacy*  Lab Work: None If you have labs (blood work) drawn today and your tests are completely normal, you will receive your results only by: Marland Kitchen MyChart Message (if you have MyChart) OR . A paper copy in the mail If you have any lab test that is abnormal or we need to change your treatment, we will call you to review the results.  Testing/Procedures: nOne  Follow-Up: At [facility], you and your health needs are our priority.  As part of our continuing mission to provide you with exceptional heart care, we have created designated Provider Care Teams.  These Care Teams include your primary Cardiologist (physician) and Advanced Practice Providers (APPs -  Physician Assistants and Nurse Practitioners) who all work together to provide you with the care you need, when you need it.  Your next appointment:   3 month(s)  The format for your next appointment:   In Person  Provider:   [name], DO  Other Instructions

## 2019-11-23 NOTE — Telephone Encounter (Signed)
Returned call to Pt.  Left VM.  Pt has appt today 12/30 with Dr. Harriet Masson.  Per Dr. Harriet Masson, planned on discussing ECHO at this visit.  Will send back to CT scheduler to call Pt to schedule CT.

## 2019-11-23 NOTE — Progress Notes (Signed)
Cardiology Office Note:    Date:  11/23/2019   ID:  Hayes, Teresa 03-21-1966, MRN [id number]  PCP:  Algis Greenhouse, MD  Cardiologist:  [name], DO  Electrophysiologist:  None   Referring MD: Algis Greenhouse, MD   Follow-up for hypertension and chest pain  History of Present Illness:    Teresa Hayes Reddingis a 53 y.o.femalewith a hx of hypertension,morbid obesity, IMC of the left breast withER/PR+, Her2- and family history of premature coronary artery .Patient is planning surgery (lumpectomy), and radiation.  The patient was seen on 09/12/2019 at that time she reported chest pain that was concerning for angina symptoms. At the conclusion of her visit, I recommend a pharmacologic nuclear stress test and an echocardiogram. In addition nitroglycerin SL was ordered and the patient was educated on how to use this medication.  The patient requested this visit on October 07, 2019 due to significant chest pain.  At that time she reported midsternum burning sensation with no radiation which required her to take 2 nitroglycerin pills.  She noted that the pain was so severe that she almost went to the emergency department to be evaluated. The patient did tell me me that this time her chest pain was associated with palpitations. She stated that the palpitation was a rapid onset of fast heart beat which last for a few seconds prior to resolution   At the conclusion of visit I started the patient on Imdur in hopes to treat her angina symptoms as well as of Toprol XL 12.5 mg was also started.  A ZIO monitor was placed on the patient which results had been previously called out to her with 1 asymptomatic run of supraventricular tachycardia.  Today she tells me that she had not been taking the Imdur or the Toprol-XL.  She does not want to take a lot of medication she says.  She denies any repeat chest pain since her last office visit.  No other complaints at this  time.   Past Medical History:  Diagnosis Date  . Arthritis    knees  . Family history of pancreatic cancer   . Hypertension   . Lymph edema    idiopathic  . Malignant neoplasm of lower-outer quadrant of left breast of female, estrogen receptor positive ([place]) 08/24/2019  . Peripheral vascular disease (HCC)    lymphedema - right leg - wears comp hose  . SVD (spontaneous vaginal delivery) x 2    Past Surgical History:  Procedure Laterality Date  . CHOLECYSTECTOMY    . HYSTEROSCOPY WITH D & C  10/14/2012   Procedure: DILATATION AND CURETTAGE /HYSTEROSCOPY;  Surgeon: Thurnell Lose, MD;  Location: [facility];  Service: Gynecology;  Laterality: N/A;  wants ultrasound guidance  ask scheduler to remember to tell surgeon to put order in  . KNEE SURGERY Right     Current Medications: Current Meds  Medication Sig  . anastrozole (ARIMIDEX) 1 MG tablet Take 1 tablet (1 mg total) by mouth daily.  Marland Kitchen lisinopril (ZESTRIL) 20 MG tablet Take 1 tablet (20 mg total) by mouth daily. Take with Zestoretic for a total of 2m /12.51m . lisinopril-hydrochlorothiazide (ZESTORETIC) 20-12.5 MG tablet Take 1 tablet by mouth daily. Take with lisinopril 20 for total of 4042m2.5 mg     Allergies:   Ultram [tramadol], Topiramate, and Shellfish allergy   Social History   Socioeconomic History  . Marital status: Single    Spouse name: Not on file  . Number of  children: Not on file  . Years of education: Not on file  . Highest education level: Not on file  Occupational History  . Not on file  Tobacco Use  . Smoking status: Never Smoker  . Smokeless tobacco: Never Used  Substance and Sexual Activity  . Alcohol use: Yes    Alcohol/week: 0.0 standard drinks    Comment: rarely  . Drug use: No  . Sexual activity: Not Currently    Birth control/protection: None  Other Topics Concern  . Not on file  Social History Narrative  . Not on file   Social Determinants of Health   Financial Resource Strain:    . Difficulty of Paying Living Expenses: Not on file  Food Insecurity:   . Worried About Charity fundraiser in the Last Year: Not on file  . Ran Out of Food in the Last Year: Not on file  Transportation Needs:   . Lack of Transportation (Medical): Not on file  . Lack of Transportation (Non-Medical): Not on file  Physical Activity:   . Days of Exercise per Week: Not on file  . Minutes of Exercise per Session: Not on file  Stress:   . Feeling of Stress : Not on file  Social Connections:   . Frequency of Communication with Friends and Family: Not on file  . Frequency of Social Gatherings with Friends and Family: Not on file  . Attends Religious Services: Not on file  . Active Member of Clubs or Organizations: Not on file  . Attends Archivist Meetings: Not on file  . Marital Status: Not on file     Family History: The patient's family history includes Cancer in her maternal grandfather; Pancreatic cancer in her maternal grandmother.  ROS:   Review of Systems  Constitution: Negative for decreased appetite, fever and weight gain.  HENT: Negative for congestion, ear discharge, hoarse voice and sore throat.   Eyes: Negative for discharge, redness, vision loss in right eye and visual halos.  Cardiovascular: Negative for chest pain, dyspnea on exertion, leg swelling, orthopnea and palpitations.  Respiratory: Negative for cough, hemoptysis, shortness of breath and snoring.   Endocrine: Negative for heat intolerance and polyphagia.  Hematologic/Lymphatic: Negative for bleeding problem. Does not bruise/bleed easily.  Skin: Negative for flushing, nail changes, rash and suspicious lesions.  Musculoskeletal: Negative for arthritis, joint pain, muscle cramps, myalgias, neck pain and stiffness.  Gastrointestinal: Negative for abdominal pain, bowel incontinence, diarrhea and excessive appetite.  Genitourinary: Negative for decreased libido, genital sores and incomplete emptying.   Neurological: Negative for brief paralysis, focal weakness, headaches and loss of balance.  Psychiatric/Behavioral: Negative for altered mental status, depression and suicidal ideas.  Allergic/Immunologic: Negative for HIV exposure and persistent infections.    EKGs/Labs/Other Studies Reviewed:    The following studies were reviewed today:   EKG: None   Thoracic echocardiogram IMPRESSIONS  1. Grossly normal echocardiogram.  2. Left ventricular ejection fraction, by visual estimation, is 60 to 65%. The left ventricle has normal function. There is borderline left ventricular hypertrophy.  3. Left ventricular diastolic parameters are consistent with Grade I diastolic dysfunction (impaired relaxation).  4. The left ventricle has no regional wall motion abnormalities.  5. Global right ventricle has normal systolic function.The right ventricular size is normal. No increase in right ventricular wall thickness.  6. Left atrial size was normal.  7. Right atrial size was normal.  8. The mitral valve is normal in structure. No evidence of mitral valve regurgitation.  No evidence of mitral stenosis.  9. The tricuspid valve is normal in structure. Tricuspid valve regurgitation is not demonstrated. 10. The aortic valve is normal in structure. Aortic valve regurgitation is not visualized. No evidence of aortic valve sclerosis or stenosis.  ZIO monitor The patient wore the monitor for 4 days 22 hours. Indication: Palpitations The minimum heart rate was 48 bpm and the maximum heart rate was 141 bpm and the average heart rate was 76 bpm. The predominant rhythm was sinus.  1 run of  Supraventricular Tachycardia occurred lasting 5 beats with a maximum rate of 141 bpm (avg 127 bpm). Premature atrial complexes were rare (<1%). Premature ventricular complexes were rare (<1%).  14 patient triggered events were noted, 1 was associated with premature atrial complexes the remaining of the triggered events  were associated with sinus rhythm.  No ventricular tachycardia, No Pause, No AV block and no atrial fibrillation present. Conclusion: This study is remarkable for 1 asymptomatic run of supraventricular tachycardia which is likely paroxysmal atrial tachycardia with variable block.  Recent Labs: 09/07/2019: ALT 14; Hemoglobin 13.3; Platelet Count 272 09/12/2019: BUN 13; Creatinine, Ser 0.83; Magnesium 2.2; Potassium 4.3; Sodium 137  Recent Lipid Panel No results found for: CHOL, TRIG, HDL, CHOLHDL, VLDL, LDLCALC, LDLDIRECT  Physical Exam:    VS:  BP (!) 144/78   Pulse (!) 104   Ht _0  (1.676 m)   Wt (!) 369 lb (167.4 kg)   LMP 06/02/2015 (Exact Date)   SpO2 97%   BMI 59.56 kg/m     Wt Readings from Last 3 Encounters:  11/23/19 (!) 369 lb (167.4 kg)  10/07/19 (!) 368 lb (166.9 kg)  09/12/19 (!) 376 lb (170.6 kg)     GEN: Well nourished, well developed in no acute distress HEENT: Normal NECK: No JVD; No carotid bruits LYMPHATICS: No lymphadenopathy CARDIAC: S1S2 noted,RRR, no murmurs, rubs, gallops RESPIRATORY:  Clear to auscultation without rales, wheezing or rhonchi  ABDOMEN: Soft, non-tender, non-distended, +bowel sounds, no guarding. EXTREMITIES: No edema, No cyanosis, no clubbing MUSCULOSKELETAL:  No edema; No deformity  SKIN: Warm and dry NEUROLOGIC:  Alert and oriented x 3, non-focal PSYCHIATRIC:  Normal affect, good insight  ASSESSMENT:    1. Essential hypertension   2. Chest pain of uncertain etiology   3. Morbid obesity ([place])    PLAN:    She remains to be hypertensive in the office today.  But she had not taken her previously ordered Toprol-XL 12.5 mg daily.  She is only taking lisinopril 20 mg twice daily, hydrochlorothiazide 12.5 daily.  I encouraged her that she will need to start Toprol-XL 12.5 mg daily which hopefully will help the blood pressure and her palpitations.  I discussed the results of her echocardiogram with her today.  Her CTA coronaries for  her chest pain is still pending.  She has been actively dieting and is looking forward to losing more weight.   The patient is in agreement with the above plan. The patient left the office in stable condition.  The patient will follow up in 3 months or sooner if needed.   Medication Adjustments/Labs and Tests Ordered: Current medicines are reviewed at length with the patient today.  Concerns regarding medicines are outlined above.  No orders of the defined types were placed in this encounter.  Meds ordered this encounter  Medications  . metoprolol succinate (TOPROL XL) 25 MG 24 hr tablet    Sig: Take 0.5 tablets (12.5 mg total) by mouth daily.  Dispense:  45 tablet    Refill:  1    Patient Instructions  Medication Instructions:  Your physician has recommended you make the following change in your medication:   RESTART: Toprol XL 25 mg Take 1/2 tab(12.5 mg) daily  *If you need a refill on your cardiac medications before your next appointment, please call your pharmacy*  Lab Work: None If you have labs (blood work) drawn today and your tests are completely normal, you will receive your results only by: Marland Kitchen MyChart Message (if you have MyChart) OR . A paper copy in the mail If you have any lab test that is abnormal or we need to change your treatment, we will call you to review the results.  Testing/Procedures: nOne  Follow-Up: At [facility], you and your health needs are our priority.  As part of our continuing mission to provide you with exceptional heart care, we have created designated Provider Care Teams.  These Care Teams include your primary Cardiologist (physician) and Advanced Practice Providers (APPs -  Physician Assistants and Nurse Practitioners) who all work together to provide you with the care you need, when you need it.  Your next appointment:   3 month(s)  The format for your next appointment:   In Person  Provider:   [name], DO  Other  Instructions      Adopting a Healthy Lifestyle.  Know what a healthy weight is for you (roughly BMI <25) and aim to maintain this   Aim for 7+ servings of fruits and vegetables daily   65-80+ fluid ounces of water or unsweet tea for healthy kidneys   Limit to max 1 drink of alcohol per day; avoid smoking/tobacco   Limit animal fats in diet for cholesterol and heart health - choose grass fed whenever available   Avoid highly processed foods, and foods high in saturated/trans fats   Aim for low stress - take time to unwind and care for your mental health   Aim for 150 min of moderate intensity exercise weekly for heart health, and weights twice weekly for bone health   Aim for 7-9 hours of sleep daily   When it comes to diets, agreement about the perfect plan isnt easy to find, even among the experts. Experts at the [employer] developed an idea known as the Healthy Eating Plate. Just imagine a plate divided into logical, healthy portions.   The emphasis is on diet quality:   Load up on vegetables and fruits - one-half of your plate: Aim for color and variety, and remember that potatoes dont count.   Go for whole grains - one-quarter of your plate: Whole wheat, barley, wheat berries, quinoa, oats, brown rice, and foods made with them. If you want pasta, go with whole wheat pasta.   Protein power - one-quarter of your plate: Fish, chicken, beans, and nuts are all healthy, versatile protein sources. Limit red meat.   The diet, however, does go beyond the plate, offering a few other suggestions.   Use healthy plant oils, such as olive, canola, soy, corn, sunflower and peanut. Check the labels, and avoid partially hydrogenated oil, which have unhealthy trans fats.   If youre thirsty, drink water. Coffee and tea are good in moderation, but skip sugary drinks and limit milk and dairy products to one or two daily servings.   The type of carbohydrate in the diet  is more important than the amount. Some sources of carbohydrates, such as vegetables,  fruits, whole grains, and beans-are healthier than others.   Finally, stay active  Signed, [name], DO  11/23/2019 9:39 PM    [facility]

## 2019-11-29 ENCOUNTER — Encounter: Payer: Self-pay | Admitting: *Deleted

## 2019-11-29 ENCOUNTER — Telehealth: Payer: Self-pay | Admitting: Cardiology

## 2019-11-29 NOTE — Telephone Encounter (Signed)
Telephone call to patient. States her insurance auth for her cardiac Cta is only good until 12/23/19. Reached out to pre cert ,Ct scheduling and Dorena Bodo by staff mail to rectify the situation by either getting an extension of the prior auth or rescheduling to an earlier appointment.

## 2019-11-29 NOTE — Telephone Encounter (Signed)
Wants to change her CT appt to a different facility

## 2019-11-30 ENCOUNTER — Other Ambulatory Visit: Payer: Self-pay

## 2019-11-30 ENCOUNTER — Ambulatory Visit
Admission: RE | Admit: 2019-11-30 | Discharge: 2019-11-30 | Disposition: A | Discharge status: Home / Self Care | Payer: 59 | Source: Ambulatory Visit | Attending: Oncology | Admitting: Oncology

## 2019-11-30 MED ORDER — GADOBUTROL 1 MMOL/ML IV SOLN
10.0000 mL | Freq: Once | INTRAVENOUS | Status: AC | PRN
  Administered 2019-11-30: 10 mL via INTRAVENOUS

## 2019-12-05 ENCOUNTER — Other Ambulatory Visit: Payer: Self-pay | Admitting: Oncology

## 2019-12-05 NOTE — Progress Notes (Signed)
Called to check-in on patient on 12/05/19. Pt stated she did not have time to talk, but requested to schedule a new telehealth counseling appt via phone.   Next counseling appt: Monday, December 12, 2019 via phone  Art Buff [facility] Counseling Intern Voicemail:  [phone]

## 2019-12-06 ENCOUNTER — Other Ambulatory Visit: Payer: Self-pay | Admitting: *Deleted

## 2019-12-06 ENCOUNTER — Telehealth: Payer: Self-pay | Admitting: *Deleted

## 2019-12-06 ENCOUNTER — Other Ambulatory Visit: Payer: Self-pay | Admitting: Oncology

## 2019-12-06 DIAGNOSIS — R928 Other abnormal and inconclusive findings on diagnostic imaging of breast: Secondary | ICD-10-CM

## 2019-12-06 DIAGNOSIS — C50512 Malignant neoplasm of lower-outer quadrant of left female breast: Secondary | ICD-10-CM

## 2019-12-06 NOTE — Progress Notes (Signed)
m °

## 2019-12-06 NOTE — Telephone Encounter (Signed)
Spoke with patient to let her know she has another area in the left breast that needs additional biopsy.  She is agreeable.  Orders placed and message sent to schedule.

## 2019-12-09 ENCOUNTER — Other Ambulatory Visit: Payer: Self-pay | Admitting: *Deleted

## 2019-12-09 ENCOUNTER — Encounter: Payer: Self-pay | Admitting: *Deleted

## 2019-12-12 NOTE — Progress Notes (Signed)
Spoke to patient on 12/12/19 via phone for a telehealth counseling appointment. Counseling intern provided therapeutic listening, emotional support, and normalized feelings as pt discussed stress associated with her cancer dx. Pt reported she felt "devestated" to learn she has a second lump in her breast. Pt stated she is nervous about the upcoming biopsy procedure because it requires her to feel like she is trapped when she is in the MRI. Counselor and pt discussed relaxation strategies including using slow controlled breath. Pt also reported distress related to co-workers not wearing masks in the office, a concern which she stated she has discussed with her supervisor. Pt states she has many risk factors which make Covid-19 dangerous for her. She stated no longer goes inside any buildings other than her office and for medical appointments to limit her risk of exposure to Covid-19. Pt has limited social interaction which only includes communicating with a few family members via phone, but feels that face-to-face interaction is too risky for her at this point considering the amount of Covid-19 cases in the county. Pt is hopeful that she can get a vaccine in the next few months and stated that once she is vaccinated and more of the community is vaccinated she will feel more comfortable going out.   Next Counseling Appt: Monday, December 19, 2019 at 11:00am  Art Buff [facility] Counseling Intern Voicemail:  [phone]

## 2019-12-15 ENCOUNTER — Telehealth: Payer: Self-pay | Admitting: Oncology

## 2019-12-15 NOTE — Telephone Encounter (Signed)
Returned patient's phone call regarding cancelling 01/25 appointment, per patient's request appointment has been cancelled.

## 2019-12-19 ENCOUNTER — Ambulatory Visit: Payer: 59 | Admitting: Oncology

## 2019-12-19 ENCOUNTER — Inpatient Hospital Stay: Payer: 59

## 2019-12-19 NOTE — Progress Notes (Signed)
Called patient for a telehealth counseling session via phone. Pt stated she is unable to talk at this time. Counseling intern will follow-up with pt next week.  Art Buff [place] Counseling Intern Voicemail:  [phone]

## 2019-12-20 ENCOUNTER — Telehealth: Payer: Self-pay | Admitting: Cardiology

## 2019-12-20 MED ORDER — ISOSORBIDE MONONITRATE ER 30 MG PO TB24: 30.0000 mg | ORAL_TABLET | Freq: Every day | ORAL | 1 refills | Status: DC

## 2019-12-20 NOTE — Telephone Encounter (Signed)
Ok. Is there anything else she can take?

## 2019-12-20 NOTE — Telephone Encounter (Signed)
She can take 1 nitroglycerin wait for 5 minutes before taking a second dose after taking a second dose of nitroglycerin and if she feels that she still experiencing chest pain after 5 minutes and may need a third dose she needs to call EMS prior to taking a third dose of nitroglycerin.

## 2019-12-20 NOTE — Telephone Encounter (Signed)
Yes, I started her on Imdur 30 mg daily but she never taken that medication and did not want to take it because she was on too many medications.    Ideally she should be on Imdur 30 mg daily as well as Toprol-XL 12.5 mg daily.  Please let her know that taking the Imdur will really help as this is a long-acting nitro and it helps her from not taking separate doses sublingual nitroglycerin.    I did educate her about this at her last visit but she was adamant that she did not want to take more medications

## 2019-12-20 NOTE — Telephone Encounter (Signed)
Patient states that she is concerned because she is having a procedure soon, however, she is uncertain as to if it is safe for her to have surgery with angina and SOB. Please call and advise.  Pt c/o of Chest Pain: STAT if CP now or developed within 24 hours  1. Are you having CP right now? No  2. Are you experiencing any other symptoms (ex. SOB, nausea, vomiting, sweating)? Dizziness, fatigue, SOB  3. How long have you been experiencing CP? 2 days  4. Is your CP continuous or coming and going? Coming and going  5. Have you taken Nitroglycerin? Yes, 12/19/19  Pt c/o Shortness Of Breath: STAT if SOB developed within the last 24 hours or pt is noticeably SOB on the phone  1. Are you currently SOB (can you hear that pt is SOB on the phone)? SOB  2. How long have you been experiencing SOB? Patient states, "..quite a while."  3. Are you SOB when sitting or when up moving around? Mainly when up and moving around  4. Are you currently experiencing any other symptoms? Chest pain, lightheadedness, fatigue  ?

## 2019-12-20 NOTE — Telephone Encounter (Signed)
Telephone call to patient. States has been having an increase in CP that resolves with NTG. States she is "wiped out " most of the time .Has as scheduled cardiac Cta on 12/27/19. Also wants to knowhow many NTG she can take in a 24 hour period before she goes to the ED.Please advise

## 2019-12-20 NOTE — Addendum Note (Signed)
Addended by: Particia Nearing B on: 12/20/2019 12:11 PM   Modules accepted: Orders

## 2019-12-20 NOTE — Telephone Encounter (Signed)
Telephone call to patient. Informed her Dr Harriet Masson prefers she take the Imdur 30 mg daily as previously prescribed to manage her chest pain. States she is taking the toprol but will start the Imdur today

## 2019-12-23 MED ORDER — DIAZEPAM 5 MG PO TABS: ORAL_TABLET | ORAL | 0 refills | Status: DC

## 2019-12-27 ENCOUNTER — Ambulatory Visit (HOSPITAL_COMMUNITY): Payer: 59

## 2019-12-28 ENCOUNTER — Telehealth: Payer: Self-pay | Admitting: *Deleted

## 2019-12-28 DIAGNOSIS — R072 Precordial pain: Secondary | ICD-10-CM

## 2019-12-28 NOTE — Telephone Encounter (Signed)
-----   Message from Howie Ill sent at 12/26/2019  2:13 PM EST ----- Regarding: Cardiac ct Mickel Baas   Ms [place] insurance is appealing the Cardiac Ct and I will need a new referral because the one in the system expired.  She wants to know if there is another test she can do in place of this test, that the insurance wont drag their feet on?  Please call patient and let me know what you decide   Thank you  Jannet Askew

## 2019-12-28 NOTE — Telephone Encounter (Signed)
Telephone call to patient. Informed her the only other test DR Tobb recommends is a nuclear stress test. Patient still wants the cardiac cta. New order and referral placed.

## 2020-01-03 ENCOUNTER — Ambulatory Visit
Admission: RE | Admit: 2020-01-03 | Discharge: 2020-01-03 | Disposition: A | Discharge status: Home / Self Care | Payer: 59 | Source: Ambulatory Visit | Attending: Oncology | Admitting: Oncology

## 2020-01-03 ENCOUNTER — Other Ambulatory Visit: Payer: Self-pay | Admitting: Diagnostic Radiology

## 2020-01-03 ENCOUNTER — Other Ambulatory Visit: Payer: Self-pay

## 2020-01-03 DIAGNOSIS — Z170 Estrogen receptor positive status [ER+]: Secondary | ICD-10-CM

## 2020-01-03 DIAGNOSIS — R928 Other abnormal and inconclusive findings on diagnostic imaging of breast: Secondary | ICD-10-CM

## 2020-01-03 DIAGNOSIS — C50512 Malignant neoplasm of lower-outer quadrant of left female breast: Secondary | ICD-10-CM

## 2020-01-03 MED ORDER — GADOBUTROL 1 MMOL/ML IV SOLN
10.0000 mL | Freq: Once | INTRAVENOUS | Status: AC | PRN
  Administered 2020-01-03: 10:00:00 10 mL via INTRAVENOUS

## 2020-01-04 ENCOUNTER — Encounter: Payer: Self-pay | Admitting: *Deleted

## 2020-01-09 ENCOUNTER — Telehealth: Payer: Self-pay | Admitting: *Deleted

## 2020-01-09 NOTE — Telephone Encounter (Signed)
This RN spoke with Teresa Hayes per call stating she " wanted Dr Jana Hakim to know I stopped the Anastrozole due to it causing heart issues "  Sajda states she has history of hypertension but since starting the anastrozole she developed increased shortness of breath and angina.  This RN discussed above including need for follow up visit to discuss appropriate alternatives - appointment made by this RN for this Wednesday late in the afternoon.  Skyye is in agreement to above.

## 2020-01-11 ENCOUNTER — Inpatient Hospital Stay: Payer: 59 | Attending: Oncology | Admitting: Oncology

## 2020-01-11 ENCOUNTER — Telehealth: Payer: Self-pay | Admitting: Cardiology

## 2020-01-11 NOTE — Telephone Encounter (Signed)
Patient calling stating her insurance company has not received anything for the pre approval for her CT. Please advise.

## 2020-01-11 NOTE — Telephone Encounter (Signed)
Spoke with patient.Informed that we had to redo the whole process on getting pre authorization and that it may be a couple of weeks. Will reach out to precert and scheduling to see where she is in the process.

## 2020-01-13 ENCOUNTER — Telehealth: Payer: Self-pay | Admitting: Oncology

## 2020-01-13 ENCOUNTER — Encounter: Payer: Self-pay | Admitting: *Deleted

## 2020-01-13 NOTE — Telephone Encounter (Signed)
Scheduled pt per 2/19 sch msg. Left a voicemail with pt's new appt details.

## 2020-01-16 ENCOUNTER — Telehealth: Payer: Self-pay | Admitting: Oncology

## 2020-01-16 ENCOUNTER — Encounter: Payer: Self-pay | Admitting: *Deleted

## 2020-01-16 NOTE — Telephone Encounter (Signed)
Called pt per 2/22 sch message- unable to reach pt- left message for pt to call back to reschedule

## 2020-01-16 NOTE — Progress Notes (Signed)
Teresa Hayes  Telephone:([phone]) [phone] Fax:(336) [phone]     ID: Teresa Hayes DOB: 06-21-1966  MR#: [id number]  FGH#:[id number]  Patient Care Team: Teresa Greenhouse, MD as PCP - General (Unknown Physician Specialty) Teresa Salines, DO as PCP - Cardiology (Cardiology) Teresa Kaufmann, RN as Oncology Nurse Navigator Teresa Germany, RN as Oncology Nurse Navigator Teresa Hayes, Teresa Dad, MD as Consulting Physician (Oncology) Teresa Rudd, MD as Consulting Physician (Radiation Oncology) Teresa Klein, MD as Consulting Physician (General Surgery) Teresa Hayes, Teresa Dad, MD as Consulting Physician (Oncology) Teresa Rudd, MD as Consulting Physician (Radiation Oncology) Teresa Lose, MD as Consulting Physician (Obstetrics and Gynecology) Teresa Cruel, MD OTHER MD:  CHIEF COMPLAINT: estrogen receptor positive breast cancer  CURRENT TREATMENT:    INTERVAL HISTORY: Teresa Hayes did not show for her 01/17/2020 visit  Since her last visit here her genetic testing results came back negative.  She underwent echocardiogram on 11/14/2019 showing an ejection fraction of 60-65%.  She underwent breast MRI on 11/30/2019 that showed: breast composition A; the exam was markedly limited secondary to patient's body habitus and chest wall port, so the patient was brought back on 1/11/20201 for repeat imaging with only slight improvement; suspicious non-mass enhancement in upper-outer left breast; no right breast malignancy; no suspicious lymphadenopathy.  She proceeded to biopsy of the new left breast area on 01/03/2020.Pathology [phone]) showed mild fibrocystic change with calcifications.  She was placed on anastrozole at consultation. She called to inform us on 01/09/2020 that she was no longer taking the anastrozole "due to it causing heart issues." Of note, she has been followed by spiritual care/counseling for anxiety surrounding her diagnosis.  Of note, she also underwent  coronavirus testing on 11/02/2019 at [facility], and results were negative.   REVIEW OF SYSTEMS: Teresa Hayes    HISTORY OF CURRENT ILLNESS: From the original intake note:  Teresa Hayes presented to her OBGYN with a tender, palpable left breast abnormality for several months. She was set up for bilateral diagnostic mammography with tomography and left breast ultrasonography at [facility] on 08/17/2019 showing: breast density category B; 2.1 cm left breast mas at 3:30; left axilla negative for adenopathy.  Accordingly on 08/22/2019 she proceeded to biopsy of the left breast area in question. The pathology from this procedure ([id number]) showed: invasive lobular carcinoma, grade 1-2, e-cadherin negative. Prognostic indicators significant for: estrogen receptor, 100% positive and progesterone receptor, 100% positive, both with strong staining intensity. Proliferation marker Ki67 at 10%. HER2 negative by immunohistochemistry (1+).  The patient's subsequent history is as detailed below.   PAST MEDICAL HISTORY: Past Medical History:  Diagnosis Date  . Arthritis    knees  . Family history of pancreatic cancer   . Hypertension   . Lymph edema    idiopathic  . Malignant neoplasm of lower-outer quadrant of left breast of female, estrogen receptor positive ([facility]) 08/24/2019  . Peripheral vascular disease (HCC)    lymphedema - right leg - wears comp hose  . SVD (spontaneous vaginal delivery) x 2    PAST SURGICAL HISTORY: Past Surgical History:  Procedure Laterality Date  . CHOLECYSTECTOMY    . HYSTEROSCOPY WITH D & C  10/14/2012   Procedure: DILATATION AND CURETTAGE /HYSTEROSCOPY;  Surgeon: Teresa Lose, MD;  Location: [facility];  Service: Gynecology;  Laterality: N/A;  wants ultrasound guidance  ask scheduler to remember to tell surgeon to put order in  . KNEE SURGERY Right     FAMILY HISTORY: Family History  Problem Relation Age of Onset  . Pancreatic cancer Maternal Grandmother          dx 33s  . Cancer Maternal Grandfather        unk type, possibly GI   Patient's father was 73 years old when he died from a ruptured abdominal aortic aneurysm. Patient's mother is age 17 (as of 08/2019). The patient denies a family hx of breast or ovarian cancer. She does note pancreatic cancer in her maternal grandmother and some form of GI cancer in her paternal grandmother. She has 2 siblings, 1 brother and 1 sister.   GYNECOLOGIC HISTORY:  Patient's last menstrual period was 06/02/2015 (exact date). Menarche: 54 years old Age at first live birth: 54 years old [place] P 2 Contraceptive: pills from 1982 - 1985 and injections from 1996 - 2004, 2005 - 2009. HRT: progesterone in 2014, 2018 - 2019,  Hysterectomy? no BSO? 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 She is single. She lives by herself, no pets. Daughter Teresa Hayes, age 78, works as an Web designer in an Merchandiser, retail in [place], [place]. Son Teresa Hayes, age 37, works in Scientist, research (medical) in [place], [place].  The patient has no grandchildren. She does not attend a church.    ADVANCED DIRECTIVES: not in place. She intends to name her daughter, Teresa Hayes, as her 35. (Teresa Hayes is currently listed as the patient's emergency contact).  The appropriate documents for her to complete a notarized were provided on the 09/07/2019 visit   HEALTH MAINTENANCE: Social History   Tobacco Use  . Smoking status: Never Smoker  . Smokeless tobacco: Never Used  Substance Use Topics  . Alcohol use: Yes    Alcohol/week: 0.0 standard drinks    Comment: rarely  . Drug use: No     Colonoscopy: none on file  PAP: 06/2016, normal  Bone density: none on file   Allergies  Allergen Reactions  . Ultram [Tramadol] Itching  . Topiramate Other (See Comments)    Intolerant   . Shellfish Allergy Rash    Current Outpatient Medications  Medication Sig Dispense Refill  . diazepam  (VALIUM) 5 MG tablet Take 1 tablet 30 minutes before procedure and then take 1 tablet upon arrival for procedure 2 tablet 0  . isosorbide mononitrate (IMDUR) 30 MG 24 hr tablet Take 1 tablet (30 mg total) by mouth daily. 90 tablet 1  . lisinopril (ZESTRIL) 20 MG tablet Take 1 tablet (20 mg total) by mouth daily. Take with Zestoretic for a total of 40m /12.594m90 tablet 3  . lisinopril-hydrochlorothiazide (ZESTORETIC) 20-12.5 MG tablet Take 1 tablet by mouth daily. Take with lisinopril 20 for total of 4071m2.5 mg    . metoprolol succinate (TOPROL XL) 25 MG 24 hr tablet Take 0.5 tablets (12.5 mg total) by mouth daily. 45 tablet 1   No current facility-administered medications for this visit.    OBJECTIVE:    There were no vitals filed for this visit.   There is no height or weight on file to calculate BMI.   Wt Readings from Last 3 Encounters:  11/23/19 (!) 369 lb (167.4 kg)  10/07/19 (!) 368 lb (166.9 kg)  09/12/19 (!) 376 lb (170.6 kg)      LAB RESULTS:  CMP     Component Value Date/Time   NA 137 09/12/2019 1529   K 4.3 09/12/2019 1529   CL 100 09/12/2019 1529   CO2 22 09/12/2019  1529   GLUCOSE 92 09/12/2019 1529   GLUCOSE 106 (H) 09/07/2019 1220   BUN 13 09/12/2019 1529   CREATININE 0.83 09/12/2019 1529   CREATININE 0.89 09/07/2019 1220   CALCIUM 9.4 09/12/2019 1529   PROT 8.2 (H) 09/07/2019 1220   ALBUMIN 3.5 09/07/2019 1220   AST 15 09/07/2019 1220   ALT 14 09/07/2019 1220   ALKPHOS 87 09/07/2019 1220   BILITOT 0.3 09/07/2019 1220   GFRNONAA 80 09/12/2019 1529   GFRNONAA >60 09/07/2019 1220   GFRAA 92 09/12/2019 1529   GFRAA >60 09/07/2019 1220   No results found for: TOTALPROTELP, ALBUMINELP, A1GS, A2GS, BETS, BETA2SER, GAMS, MSPIKE, SPEI  No results found for: KPAFRELGTCHN, LAMBDASER, KAPLAMBRATIO  Lab Results  Component Value Date   WBC 8.8 09/07/2019   NEUTROABS 5.4 09/07/2019   HGB 13.3 09/07/2019   HCT 40.1 09/07/2019   MCV 96.9 09/07/2019   PLT 272  09/07/2019   No results found for: LABCA2  No components found for: ALPFXT024  No results for input(s): INR in the last 168 hours.  No results found for: LABCA2  No results found for: OXB353  No results found for: GDJ242  No results found for: AST419  No results found for: CA2729  No components found for: HGQUANT  No results found for: CEA1 / No results found for: CEA1   No results found for: AFPTUMOR  No results found for: CHROMOGRNA  No results found for: HGBA, HGBA2QUANT, HGBFQUANT, HGBSQUAN (Hemoglobinopathy evaluation)   No results found for: LDH  No results found for: IRON, TIBC, IRONPCTSAT (Iron and TIBC)  No results found for: FERRITIN  Urinalysis    Component Value Date/Time   COLORURINE AMBER (A) 06/02/2015 1125   APPEARANCEUR CLOUDY (A) 06/02/2015 1125   LABSPEC 1.025 06/02/2015 1125   PHURINE 6.0 06/02/2015 1125   GLUCOSEU NEGATIVE 06/02/2015 1125   HGBUR LARGE (A) 06/02/2015 1125   BILIRUBINUR SMALL (A) 06/02/2015 1125   KETONESUR NEGATIVE 06/02/2015 1125   PROTEINUR 30 (A) 06/02/2015 1125   UROBILINOGEN 0.2 06/02/2015 1125   NITRITE NEGATIVE 06/02/2015 1125   LEUKOCYTESUR TRACE (A) 06/02/2015 1125     STUDIES: MM CLIP PLACEMENT LEFT  Result Date: 01/03/2020 CLINICAL DATA:  MRI guided biopsy performed earlier today. EXAM: DIAGNOSTIC LEFT MAMMOGRAM POST MRI BIOPSY COMPARISON:  Previous exam(s). FINDINGS: Mammographic images were obtained following MRI guided biopsy of suspicious non-mass enhancement within the upper outer quadrant of the LEFT breast at anterior depth. The biopsy marking clip is in expected position at the site of biopsy. IMPRESSION: Appropriate positioning of the barbell shaped biopsy marking clip at the site of biopsy in the upper-outer quadrant of the LEFT breast at anterior depth, corresponding to the targeted non-mass enhancement on MRI. Final Assessment: Post Procedure Mammograms for Marker Placement Electronically Signed   By:  Franki Cabot M.D.   On: 01/03/2020 10:17   MR LT BREAST BX W LOC DEV 1ST LESION IMAGE BX SPEC MR GUIDE  Addendum Date: 01/05/2020   ADDENDUM REPORT: 01/04/2020 14:25 ADDENDUM: Pathology revealed MILD FIBROCYSTIC CHANGE WITH CALCIFICATIONS of the LEFT breast, upper. This was found to be concordant by Dr. Franki Cabot. Pathology results were discussed with the patient by telephone. The patient reported doing well after the biopsy with tenderness at the site. Post biopsy instructions and care were reviewed and questions were answered. The patient was encouraged to call [facility] for any additional concerns. The patient has a recent diagnosis of left  breast cancer and should follow her outlined treatment plan. Bilateral breast MRI recommended in 6 months per protocol. Pathology results reported by Stacie Acres RN on 01/04/2020. Electronically Signed   By: Franki Cabot M.D.   On: 01/04/2020 14:25   Result Date: 01/05/2020 CLINICAL DATA:  Patient with suspicious non-mass enhancement in the upper-outer LEFT breast presents today for MRI guided biopsy. Recent diagnosis of invasive lobular carcinoma in the lower outer quadrant of the LEFT breast. EXAM: MRI GUIDED CORE NEEDLE BIOPSY OF THE LEFT BREAST TECHNIQUE: Multiplanar, multisequence MR imaging of the LEFT breast was performed both before and after administration of intravenous contrast. CONTRAST:  90m GADAVIST GADOBUTROL 1 MMOL/ML IV SOLN COMPARISON:  Previous exams. FINDINGS: I met with the patient, and we discussed the procedure of MRI guided biopsy, including risks, benefits, and alternatives. Specifically, we discussed the risks of infection, bleeding, tissue injury, clip migration, and inadequate sampling. Informed, written consent was given. The usual time out protocol was performed immediately prior to the procedure. Using sterile technique, 1% Lidocaine, MRI guidance, and a 9 gauge vacuum assisted device, biopsy was performed  of the non-mass enhancement within the upper outer quadrant of the LEFT breast, at anterior depth, using a lateral approach. At the conclusion of the procedure, a barbell shaped tissue marker clip was deployed into the biopsy cavity. Follow-up 2-view mammogram was performed and dictated separately. IMPRESSION: MRI guided biopsy of suspicious non-mass enhancement within the upper-outer quadrant of the LEFT breast at anterior depth. No apparent complications. Electronically Signed: By: SFranki CabotM.D. On: 01/03/2020 10:09    ELIGIBLE FOR AVAILABLE RESEARCH PROTOCOL:  AET  ASSESSMENT: 54y.o. [place] woman status post left breast lower outer quadrant biopsy 08/22/2019 for a clinical T2 N0, stage IB invasive lobular carcinoma, estrogen and progesterone receptor strongly positive, HER-2 not amplified, with an MIB-1 of 10%.  (a) breast MRI 12/05/2019 (limited) showed a second focus of non-mass-like enhancement in the upper outer quadrant, unremarkable right breast, no abnormal lymph nodes  (b) biopsy left breast upper outer quadrant 01/03/2020 benign, concordant  (1) genetics testing 09/14/2019 through the [facility] STAT Breast Cancer Panel + Common Hereditary Cancers Panel found no deleterious mutations in ATM, BRCA1, BRCA2, CDH1, CHEK2, PALB2, PTEN, STK11 and TP53.  The Common Hereditary Cancers Panel offered by [facility] includes sequencing and/or deletion duplication testing of the following 48 genes: APC, ATM, AXIN2, BARD1, BMPR1A, BRCA1, BRCA2, BRIP1, CDH1, CDKN2A (p14ARF), CDKN2A (p16INK4a), CKD4, CHEK2, CTNNA1, DICER1, EPCAM (Deletion/duplication testing only), GREM1 (promoter region deletion/duplication testing only), KIT, MEN1, MLH1, MSH2, MSH3, MSH6, MUTYH, NBN, NF1, NHTL1, PALB2, PDGFRA, PMS2, POLD1, POLE, PTEN, RAD50, RAD51C, RAD51D, RNF43, SDHB, SDHC, SDHD, SMAD4, SMARCA4. STK11, TP53, TSC1, TSC2, and VHL.  The following genes were evaluated for sequence changes only: SDHA and HOXB13 c.251G>A  variant only.    (2) definitive surgery pending  (3) Oncotype to be obtained from the definitive surgical specimen: Chemotherapy not anticipated    (4) adjuvant radiation    (5) anastrozole started 09/08/2019 in anticipation of surgical delays; discontinued by patient 01/09/2020 because of symptoms of shortness of breath and angina.   PLAN: SMolleedid not show for her 01/17/2020 visit  GChauncey Cruel MD   01/17/2020 11:13 AM Medical Oncology and Hematology [facility] [id number]. [phone]   Fax. [phone]  This document serves as a record of services personally performed by GLurline Del MD. It was created on his behalf by  Wilburn Mylar, a trained medical scribe. The creation of this record is based on the scribe's personal observations and the provider's statements to them.   I, Lurline Del MD, have reviewed the above documentation for accuracy and completeness, and I agree with the above.   *Total Encounter Time as defined by the Centers for Medicare and Medicaid Services includes, in addition to the face-to-face time of a patient visit (documented in the note above) non-face-to-face time: obtaining and reviewing outside history, ordering and reviewing medications, tests or procedures, care coordination (communications with other health care professionals or caregivers) and documentation in the medical record.

## 2020-01-17 ENCOUNTER — Encounter: Payer: Self-pay | Admitting: Oncology

## 2020-01-17 ENCOUNTER — Inpatient Hospital Stay (HOSPITAL_BASED_OUTPATIENT_CLINIC_OR_DEPARTMENT_OTHER): Payer: 59 | Admitting: Oncology

## 2020-01-17 ENCOUNTER — Telehealth: Payer: Self-pay | Admitting: *Deleted

## 2020-01-17 DIAGNOSIS — N921 Excessive and frequent menstruation with irregular cycle: Secondary | ICD-10-CM

## 2020-01-17 DIAGNOSIS — C50512 Malignant neoplasm of lower-outer quadrant of left female breast: Secondary | ICD-10-CM

## 2020-01-17 DIAGNOSIS — Z170 Estrogen receptor positive status [ER+]: Secondary | ICD-10-CM

## 2020-01-17 DIAGNOSIS — M7662 Achilles tendinitis, left leg: Secondary | ICD-10-CM

## 2020-01-17 NOTE — Telephone Encounter (Signed)
Left message for a return phone call about patient's missed appointment today.

## 2020-01-20 ENCOUNTER — Other Ambulatory Visit: Payer: Self-pay | Admitting: Oncology

## 2020-01-20 ENCOUNTER — Encounter: Payer: Self-pay | Admitting: *Deleted

## 2020-01-20 ENCOUNTER — Telehealth: Payer: Self-pay | Admitting: Cardiology

## 2020-01-20 ENCOUNTER — Telehealth: Payer: Self-pay | Admitting: *Deleted

## 2020-01-20 NOTE — Telephone Encounter (Signed)
Telephone call to patient. Asked to call back in regards to her cardiac CTa vs nuclear stress test.

## 2020-01-20 NOTE — Telephone Encounter (Signed)
   [facility] Pre-operative Risk Assessment    Request for surgical clearance:  1. What type of surgery is being performed? Breast surgery for malignant neoplasm of lower-outer quadrant of left breast of female, estrogen receptor positive   2. When is this surgery scheduled? TBD   3. What type of clearance is required (medical clearance vs. Pharmacy clearance to hold med vs. Both)? Medical   4. Are there any medications that need to be held prior to surgery and how long? None specified    5. Practice name and name of physician performing surgery? Dr. [name] with [facility]    6. What is your office phone number [phone]    7.   What is your office fax number [phone] Attn: April Staton CMA  8.   Anesthesia type (None, local, MAC, general) ? General    Teresa Hayes M 01/20/2020, 7:36 AM  _________________________________________________________________   (provider comments below)

## 2020-01-20 NOTE — Telephone Encounter (Signed)
Pt is scheduled to see [name] 02/28/20. Looks like pt is to be set up for a CT.

## 2020-01-20 NOTE — Telephone Encounter (Signed)
   Primary Cardiologist:Kardie Tobb, DO  Chart reviewed as part of pre-operative protocol coverage.   Most recently patient was seen by Dr. Harriet Masson 10/2019 for CP and palpitation. She was not taking prescribed imdur or BB. Still pending Coronary CT.   She will require a follow-up visit in order to better assess preoperative cardiovascular risk.  Pre-op covering staff: - Please schedule appointment and call patient to inform them. - Please contact requesting surgeon's office via preferred method (i.e, phone, fax) to inform them of need for appointment prior to surgery.   [place], [place]  01/20/2020, 10:36 AM

## 2020-01-20 NOTE — Telephone Encounter (Signed)
Clearance sent to [name], DO for upcoming appt 02/28/20. I will send FYI to the surgeon as well Dr. [name].

## 2020-01-23 ENCOUNTER — Encounter: Payer: Self-pay | Admitting: *Deleted

## 2020-01-23 ENCOUNTER — Telehealth: Payer: Self-pay | Admitting: Cardiology

## 2020-01-23 ENCOUNTER — Telehealth: Payer: Self-pay | Admitting: *Deleted

## 2020-01-23 NOTE — Telephone Encounter (Signed)
Follow Up  Patient called to follow up on Dr. Theodosia Blender request to call back

## 2020-01-23 NOTE — Telephone Encounter (Signed)
Pt was denied by her insurance to get a CT done. She would like someone from the Billing/ Precert team to help her file an appeal

## 2020-01-23 NOTE — Telephone Encounter (Signed)
I am appealing, however [employer] did not consider her case to be urgent so it can take up to 60 days.  I am checking the status frequently.

## 2020-01-23 NOTE — Telephone Encounter (Signed)
Spoke with patient. She is unsure what she wants to do so an appointment was made to discuss with DR Tobb on 02/07/20

## 2020-01-23 NOTE — Telephone Encounter (Signed)
Received call back from patient and confirmed appointment for 01/30/20 at 11am.

## 2020-01-25 ENCOUNTER — Other Ambulatory Visit: Payer: Self-pay | Admitting: Adult Health

## 2020-01-25 ENCOUNTER — Other Ambulatory Visit: Payer: Self-pay | Admitting: Oncology

## 2020-01-29 NOTE — Progress Notes (Signed)
[place]  Telephone:(336) [phone] Fax:(336) [phone]     ID: Teresa Hayes DOB: 1966/10/03  MR#: [id number]  XAJ#:[id number]  Patient Care Team: Algis Greenhouse, MD as PCP - General (Unknown Physician Specialty) [name], DO as PCP - Cardiology (Cardiology) Mauro Kaufmann, RN as Oncology Nurse Navigator Rockwell Germany, RN as Oncology Nurse Navigator Furqan Gosselin, Virgie Dad, MD as Consulting Physician (Oncology) [name], MD as Consulting Physician (General Surgery) Marcellius Montagna, Virgie Dad, MD as Consulting Physician (Oncology) Kyung Rudd, MD as Consulting Physician (Radiation Oncology) Thurnell Lose, MD as Consulting Physician (Obstetrics and Gynecology) Chauncey Cruel, MD OTHER MD:  CHIEF COMPLAINT: estrogen receptor positive breast cancer  CURRENT TREATMENT: Awaiting definitive surgery.   INTERVAL HISTORY: Adalei returns today for follow up of her estrogen receptor positive breast cancer. She was originally evaluated in the multidisciplinary breast cancer clinic on 09/07/2019.  Since her last visit here her genetic testing results came back benign.  She was placed on anastrozole at consultation. She called to inform us on 01/09/2020 that she was no longer taking the anastrozole "due to it causing heart issues."  She tells me she had increased heart rate, increased blood pressure, angina and became practically bedridden.  Her hair also thinned some she says.  She had worsening lymphedema.  After stopping the medication it took almost 2 weeks for things to get back to normal.  She underwent echocardiogram on 11/14/2019 showing an ejection fraction of 60-65%.  She underwent breast MRI on 11/30/2019 that showed: breast composition A; the exam was markedly limited secondary to patient's body habitus and chest wall port, so the patient was brought back on 1/11/20201 for repeat imaging with only slight improvement; suspicious non-mass enhancement in  upper-outer left breast; no right breast malignancy; no suspicious lymphadenopathy.  She proceeded to biopsy of the new left breast area on 01/03/2020.Pathology [phone]) showed mild fibrocystic change with calcifications.  Of note, she also underwent coronavirus testing on 11/02/2019 at [facility], and results were negative.  REVIEW OF SYSTEMS: Dolorez is very concerned that the surgery has been postponed all this time.  That of course is the reason she was started on anastrozole and she did take it for about 3 months.  In the breast MRI she had in January there seems to have been an indication of response but actually the MRI was very unsatisfactory and we are going to have to wait until she has the definitive surgery.  She was started on Imdur and Toprol but is no longer taking those medications.     HISTORY OF CURRENT ILLNESS: From the original intake note:  Teresa Hayes presented to her OBGYN with a tender, palpable left breast abnormality for several months. She was set up for bilateral diagnostic mammography with tomography and left breast ultrasonography at [facility] on 08/17/2019 showing: breast density category B; 2.1 cm left breast mas at 3:30; left axilla negative for adenopathy.  Accordingly on 08/22/2019 she proceeded to biopsy of the left breast area in question. The pathology from this procedure ([id number]) showed: invasive lobular carcinoma, grade 1-2, e-cadherin negative. Prognostic indicators significant for: estrogen receptor, 100% positive and progesterone receptor, 100% positive, both with strong staining intensity. Proliferation marker Ki67 at 10%. HER2 negative by immunohistochemistry (1+).  The patient's subsequent history is as detailed below.   PAST MEDICAL HISTORY: Past Medical History:  Diagnosis Date  . Arthritis    knees  . Family history of pancreatic cancer   .  Hypertension   . Lymph edema    idiopathic  . Malignant neoplasm of lower-outer  quadrant of left breast of female, estrogen receptor positive ([facility]) 08/24/2019  . Peripheral vascular disease (HCC)    lymphedema - right leg - wears comp hose  . SVD (spontaneous vaginal delivery) x 2    PAST SURGICAL HISTORY: Past Surgical History:  Procedure Laterality Date  . CHOLECYSTECTOMY    . HYSTEROSCOPY WITH D & C  10/14/2012   Procedure: DILATATION AND CURETTAGE /HYSTEROSCOPY;  Surgeon: Thurnell Lose, MD;  Location: [facility];  Service: Gynecology;  Laterality: N/A;  wants ultrasound guidance  ask scheduler to remember to tell surgeon to put order in  . KNEE SURGERY Right     FAMILY HISTORY: Family History  Problem Relation Age of Onset  . Pancreatic cancer Maternal Grandmother        dx 39s  . Cancer Maternal Grandfather        unk type, possibly GI   Patient's father was 4 years old when he died from a ruptured abdominal aortic aneurysm. Patient's mother is age 89 (as of 08/2019). The patient denies a family hx of breast or ovarian cancer. She does note pancreatic cancer in her maternal grandmother and some form of GI cancer in her paternal grandmother. She has 2 siblings, 1 brother and 1 sister.   GYNECOLOGIC HISTORY:  Patient's last menstrual period was 06/02/2015 (exact date). Menarche: 54 years old Age at first live birth: 54 years old [place] P 2 Contraceptive: pills from 1982 - 1985 and injections from 1996 - 2004, 2005 - 2009. HRT: progesterone in 2014, 2018 - 2019,  Hysterectomy? no BSO? 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 She is single. She lives by herself, no pets. Daughter Teresa Hayes, age 8, works as an Web designer in an Merchandiser, retail in [place], [place]. Son Teresa Hayes, age 60, works in Scientist, research (medical) in [place], [place].  The patient has no grandchildren. She does not attend a church.    ADVANCED DIRECTIVES: not in place. She intends to name her daughter, Billijo Dilling, as her 77.  (Teresa Hayes is currently listed as the patient's emergency contact).  The appropriate documents for her to complete a notarized were provided on the 09/07/2019 visit   HEALTH MAINTENANCE: Social History   Tobacco Use  . Smoking status: Never Smoker  . Smokeless tobacco: Never Used  Substance Use Topics  . Alcohol use: Yes    Alcohol/week: 0.0 standard drinks    Comment: rarely  . Drug use: No     Colonoscopy: none on file  PAP: 06/2016, normal  Bone density: none on file   Allergies  Allergen Reactions  . Ultram [Tramadol] Itching  . Topiramate Other (See Comments)    Intolerant   . Shellfish Allergy Rash    Current Outpatient Medications  Medication Sig Dispense Refill  . diazepam (VALIUM) 5 MG tablet Take 1 tablet 30 minutes before procedure and then take 1 tablet upon arrival for procedure 2 tablet 0  . isosorbide mononitrate (IMDUR) 30 MG 24 hr tablet Take 1 tablet (30 mg total) by mouth daily. 90 tablet 1  . lisinopril (ZESTRIL) 20 MG tablet Take 1 tablet (20 mg total) by mouth daily. Take with Zestoretic for a total of 81m /12.553m90 tablet 3  . lisinopril-hydrochlorothiazide (ZESTORETIC) 20-12.5 MG tablet Take 1 tablet by mouth daily. Take with lisinopril 20 for total of 4023m2.5 mg    .  metoprolol succinate (TOPROL XL) 25 MG 24 hr tablet Take 0.5 tablets (12.5 mg total) by mouth daily. 45 tablet 1   No current facility-administered medications for this visit.    OBJECTIVE: Middle-aged white woman who appears younger than stated age  4:   01/30/20 1129  BP: 115/81  Pulse: (!) 107  Resp: 20  Temp: 98.2 F (36.8 C)  SpO2: 100%     Body mass index is 58.61 kg/m.   Wt Readings from Last 3 Encounters:  01/30/20 (!) 363 lb 1.6 oz (164.7 kg)  11/23/19 (!) 369 lb (167.4 kg)  10/07/19 (!) 368 lb (166.9 kg)   Sclerae unicteric, EOMs intact Wearing a mask No cervical or supraclavicular adenopathy Lungs no rales or rhonchi Heart regular rate and rhythm Abd  soft, obese, nontender, positive bowel sounds MSK no focal spinal tenderness, no upper extremity lymphedema Neuro: nonfocal, well oriented, appropriate affect Breasts: The right breast is unremarkable.  The left breast is status post recent biopsy and there appears to be a small seroma or hematoma organizing under the biopsy bruise.  This measures approximately half a centimeter.  The axillae are benign.   LAB RESULTS:  CMP     Component Value Date/Time   NA 137 09/12/2019 1529   K 4.3 09/12/2019 1529   CL 100 09/12/2019 1529   CO2 22 09/12/2019 1529   GLUCOSE 92 09/12/2019 1529   GLUCOSE 106 (H) 09/07/2019 1220   BUN 13 09/12/2019 1529   CREATININE 0.83 09/12/2019 1529   CREATININE 0.89 09/07/2019 1220   CALCIUM 9.4 09/12/2019 1529   PROT 8.2 (H) 09/07/2019 1220   ALBUMIN 3.5 09/07/2019 1220   AST 15 09/07/2019 1220   ALT 14 09/07/2019 1220   ALKPHOS 87 09/07/2019 1220   BILITOT 0.3 09/07/2019 1220   GFRNONAA 80 09/12/2019 1529   GFRNONAA >60 09/07/2019 1220   GFRAA 92 09/12/2019 1529   GFRAA >60 09/07/2019 1220   No results found for: TOTALPROTELP, ALBUMINELP, A1GS, A2GS, BETS, BETA2SER, GAMS, MSPIKE, SPEI  No results found for: KPAFRELGTCHN, LAMBDASER, KAPLAMBRATIO  Lab Results  Component Value Date   WBC 8.8 09/07/2019   NEUTROABS 5.4 09/07/2019   HGB 13.3 09/07/2019   HCT 40.1 09/07/2019   MCV 96.9 09/07/2019   PLT 272 09/07/2019   No results found for: LABCA2  No components found for: ZTIWPY099  No results for input(s): INR in the last 168 hours.  No results found for: LABCA2  No results found for: IPJ825  No results found for: KNL976  No results found for: BHA193  No results found for: CA2729  No components found for: HGQUANT  No results found for: CEA1 / No results found for: CEA1   No results found for: AFPTUMOR  No results found for: CHROMOGRNA  No results found for: HGBA, HGBA2QUANT, HGBFQUANT, HGBSQUAN (Hemoglobinopathy evaluation)    No results found for: LDH  No results found for: IRON, TIBC, IRONPCTSAT (Iron and TIBC)  No results found for: FERRITIN  Urinalysis    Component Value Date/Time   COLORURINE AMBER (A) 06/02/2015 1125   APPEARANCEUR CLOUDY (A) 06/02/2015 1125   LABSPEC 1.025 06/02/2015 1125   PHURINE 6.0 06/02/2015 1125   GLUCOSEU NEGATIVE 06/02/2015 1125   HGBUR LARGE (A) 06/02/2015 1125   BILIRUBINUR SMALL (A) 06/02/2015 1125   KETONESUR NEGATIVE 06/02/2015 1125   PROTEINUR 30 (A) 06/02/2015 1125   UROBILINOGEN 0.2 06/02/2015 1125   NITRITE NEGATIVE 06/02/2015 1125   LEUKOCYTESUR TRACE (A) 06/02/2015 1125  STUDIES: MM CLIP PLACEMENT LEFT  Result Date: 01/03/2020 CLINICAL DATA:  MRI guided biopsy performed earlier today. EXAM: DIAGNOSTIC LEFT MAMMOGRAM POST MRI BIOPSY COMPARISON:  Previous exam(s). FINDINGS: Mammographic images were obtained following MRI guided biopsy of suspicious non-mass enhancement within the upper outer quadrant of the LEFT breast at anterior depth. The biopsy marking clip is in expected position at the site of biopsy. IMPRESSION: Appropriate positioning of the barbell shaped biopsy marking clip at the site of biopsy in the upper-outer quadrant of the LEFT breast at anterior depth, corresponding to the targeted non-mass enhancement on MRI. Final Assessment: Post Procedure Mammograms for Marker Placement Electronically Signed   By: Franki Cabot M.D.   On: 01/03/2020 10:17   MR LT BREAST BX W LOC DEV 1ST LESION IMAGE BX SPEC MR GUIDE  Addendum Date: 01/05/2020   ADDENDUM REPORT: 01/04/2020 14:25 ADDENDUM: Pathology revealed MILD FIBROCYSTIC CHANGE WITH CALCIFICATIONS of the LEFT breast, upper. This was found to be concordant by Dr. Franki Cabot. Pathology results were discussed with the patient by telephone. The patient reported doing well after the biopsy with tenderness at the site. Post biopsy instructions and care were reviewed and questions were answered. The patient was  encouraged to call [facility] for any additional concerns. The patient has a recent diagnosis of left breast cancer and should follow her outlined treatment plan. Bilateral breast MRI recommended in 6 months per protocol. Pathology results reported by Stacie Acres RN on 01/04/2020. Electronically Signed   By: Franki Cabot M.D.   On: 01/04/2020 14:25   Result Date: 01/05/2020 CLINICAL DATA:  Patient with suspicious non-mass enhancement in the upper-outer LEFT breast presents today for MRI guided biopsy. Recent diagnosis of invasive lobular carcinoma in the lower outer quadrant of the LEFT breast. EXAM: MRI GUIDED CORE NEEDLE BIOPSY OF THE LEFT BREAST TECHNIQUE: Multiplanar, multisequence MR imaging of the LEFT breast was performed both before and after administration of intravenous contrast. CONTRAST:  7m GADAVIST GADOBUTROL 1 MMOL/ML IV SOLN COMPARISON:  Previous exams. FINDINGS: I met with the patient, and we discussed the procedure of MRI guided biopsy, including risks, benefits, and alternatives. Specifically, we discussed the risks of infection, bleeding, tissue injury, clip migration, and inadequate sampling. Informed, written consent was given. The usual time out protocol was performed immediately prior to the procedure. Using sterile technique, 1% Lidocaine, MRI guidance, and a 9 gauge vacuum assisted device, biopsy was performed of the non-mass enhancement within the upper outer quadrant of the LEFT breast, at anterior depth, using a lateral approach. At the conclusion of the procedure, a barbell shaped tissue marker clip was deployed into the biopsy cavity. Follow-up 2-view mammogram was performed and dictated separately. IMPRESSION: MRI guided biopsy of suspicious non-mass enhancement within the upper-outer quadrant of the LEFT breast at anterior depth. No apparent complications. Electronically Signed: By: SFranki CabotM.D. On: 01/03/2020 10:09    ELIGIBLE FOR AVAILABLE  RESEARCH PROTOCOL:  AET  ASSESSMENT: 54y.o. [place] woman status post left breast lower outer quadrant biopsy 08/22/2019 for a clinical T2 N0, stage IB invasive lobular carcinoma, estrogen and progesterone receptor strongly positive, HER-2 not amplified, with an MIB-1 of 10%.  (a) breast MRI 12/05/2019 (limited) showed a second focus of non-mass-like enhancement in the upper outer quadrant, unremarkable right breast, no abnormal lymph nodes  (b) biopsy left breast upper outer quadrant 01/03/2020 benign, concordant  (1) genetics testing 09/14/2019 through the [facility] STAT Breast Cancer Panel + Common Hereditary  Cancers Panel found no deleterious mutations in ATM, BRCA1, BRCA2, CDH1, CHEK2, PALB2, PTEN, STK11 and TP53.  The Common Hereditary Cancers Panel offered by [facility] includes sequencing and/or deletion duplication testing of the following 48 genes: APC, ATM, AXIN2, BARD1, BMPR1A, BRCA1, BRCA2, BRIP1, CDH1, CDKN2A (p14ARF), CDKN2A (p16INK4a), CKD4, CHEK2, CTNNA1, DICER1, EPCAM (Deletion/duplication testing only), GREM1 (promoter region deletion/duplication testing only), KIT, MEN1, MLH1, MSH2, MSH3, MSH6, MUTYH, NBN, NF1, NHTL1, PALB2, PDGFRA, PMS2, POLD1, POLE, PTEN, RAD50, RAD51C, RAD51D, RNF43, SDHB, SDHC, SDHD, SMAD4, SMARCA4. STK11, TP53, TSC1, TSC2, and VHL.  The following genes were evaluated for sequence changes only: SDHA and HOXB13 c.251G>A variant only.    (2) definitive surgery pending  (3) Oncotype to be obtained from the definitive surgical specimen: Chemotherapy not anticipated    (4) adjuvant radiation    (5) anastrozole started 09/08/2019 in anticipation of surgical delays; discontinued by patient 01/09/2020 because of symptoms of increased heart rate, chest pain, edema and other symptoms as noted above  (6) exemestane ordered 01/30/2020   PLAN: Chene is very anxious to proceed with surgery.  We are waiting on cardiology clearance.  While we wait antiestrogens provide  systemic coverage including in the breast and though the breast MRI 12/05/2019 was inadequate and did not specifically measure that area it does suggest evidence of response.  However Blaize feels that going back on anastrozole would be life-threatening for her.  She understands that the symptoms she described are not generally associated with this medication but there is a strong temporal association and that drug certainly is no longer an option.  We discussed the other antiestrogens which are basically the tamoxifen family and exemestane.  Exemestane would be preferable if she can tolerate it because it does not have the concerns regarding blood clots that we could get into with tamoxifen.  It however can be very expensive.  I have gone ahead and placed the prescription to her local pharmacy so she can find out what the cost is and hopefully get it started.  She is very concerned that she might have similar symptoms with exemestane as she did with anastrozole.  We decided she would start taking exemestane once a week for a week and then if she does well with that she can try Monday and Thursday the second week and then 3 times a week if tolerated.  She will continue on that pattern until she sees me again in 2 months from now.  I certainly understand her reluctance to take antiestrogens.  She knows that we are concerned about the possibility of microscopic spread outside the breast already having occurred, even long before we knew she had this cancer, and in that case local treatment alone with surgery and radiation would be ineffective.  She understands that if the cancer does recur outside the breast and axilla it would be incurable.  At this point though the goal is cure and I am hoping for good news from the definitive surgery.  Total encounter time 30 minutes.Chauncey Cruel, MD   01/30/2020 12:43 PM Medical Oncology and Hematology [facility] [place],  [address] Tel. [phone]    Fax. [phone]   This document serves as a record of services personally performed by Lurline Del, MD. It was created on his behalf by Wilburn Mylar, a trained medical scribe. The creation of this record is based on the scribe's personal observations  and the provider's statements to them.   I, Lurline Del MD, have reviewed the above documentation for accuracy and completeness, and I agree with the above.   *Total Encounter Time as defined by the Centers for Medicare and Medicaid Services includes, in addition to the face-to-face time of a patient visit (documented in the note above) non-face-to-face time: obtaining and reviewing outside history, ordering and reviewing medications, tests or procedures, care coordination (communications with other health care professionals or caregivers) and documentation in the medical record.

## 2020-01-30 ENCOUNTER — Encounter: Payer: Self-pay | Admitting: *Deleted

## 2020-01-30 ENCOUNTER — Other Ambulatory Visit: Payer: Self-pay

## 2020-01-30 ENCOUNTER — Inpatient Hospital Stay: Payer: 59 | Attending: Oncology | Admitting: Oncology

## 2020-01-30 ENCOUNTER — Telehealth: Payer: Self-pay | Admitting: Oncology

## 2020-01-30 VITALS — BP 115/81 | HR 107 | Temp 98.2°F | Resp 20 | Ht 66.0 in | Wt 363.1 lb

## 2020-01-30 DIAGNOSIS — Z79899 Other long term (current) drug therapy: Secondary | ICD-10-CM | POA: Insufficient documentation

## 2020-01-30 DIAGNOSIS — I10 Essential (primary) hypertension: Secondary | ICD-10-CM | POA: Diagnosis not present

## 2020-01-30 DIAGNOSIS — Z800 Family history of malignant neoplasm of digestive organs: Secondary | ICD-10-CM | POA: Insufficient documentation

## 2020-01-30 DIAGNOSIS — Z79811 Long term (current) use of aromatase inhibitors: Secondary | ICD-10-CM | POA: Insufficient documentation

## 2020-01-30 DIAGNOSIS — Z170 Estrogen receptor positive status [ER+]: Secondary | ICD-10-CM | POA: Diagnosis not present

## 2020-01-30 DIAGNOSIS — R609 Edema, unspecified: Secondary | ICD-10-CM | POA: Insufficient documentation

## 2020-01-30 DIAGNOSIS — I739 Peripheral vascular disease, unspecified: Secondary | ICD-10-CM | POA: Diagnosis not present

## 2020-01-30 DIAGNOSIS — C50512 Malignant neoplasm of lower-outer quadrant of left female breast: Secondary | ICD-10-CM | POA: Diagnosis not present

## 2020-01-30 MED ORDER — EXEMESTANE 25 MG PO TABS: 25.0000 mg | ORAL_TABLET | Freq: Every day | ORAL | 6 refills | Status: DC

## 2020-01-30 NOTE — Telephone Encounter (Signed)
I talk with patient regarding schedule  

## 2020-01-31 ENCOUNTER — Telehealth: Payer: Self-pay | Admitting: Cardiology

## 2020-01-31 NOTE — Telephone Encounter (Signed)
Called back and left a message.

## 2020-01-31 NOTE — Telephone Encounter (Signed)
New Message  Patient states that she missed a call from Dr. Harriet Masson last night and was returning the call. Please give patient a call back.

## 2020-02-03 ENCOUNTER — Other Ambulatory Visit: Payer: Self-pay | Admitting: Oncology

## 2020-02-07 ENCOUNTER — Ambulatory Visit: Payer: 59 | Admitting: Cardiology

## 2020-02-07 ENCOUNTER — Encounter: Payer: Self-pay | Admitting: *Deleted

## 2020-02-20 ENCOUNTER — Telehealth (HOSPITAL_COMMUNITY): Payer: Self-pay | Admitting: Emergency Medicine

## 2020-02-20 NOTE — Telephone Encounter (Signed)
Attempted to call patient regarding upcoming cardiac CT appointment. °Left message on voicemail with name and callback number °Odyn Turko RN Navigator Cardiac Imaging °[facility] °[phone] Office °[phone] Cell ° °

## 2020-02-20 NOTE — Telephone Encounter (Signed)
Pt returning phone call regarding upcoming cardiac imaging study; pt verbalizes understanding of appt date/time, parking situation and where to check in, pre-test NPO status and medications ordered, and verified current allergies; name and call back number provided for further questions should they arise Teresa Bond RN [facility] [phone] office [phone] cell   Pt states she was not given any instructions for this test so we discussed them indepth. Told her to hold dose of lisinopril-HCTZ and take metoprolol succinate 2 hr prior to test.   Pt appreciated the call as she was under impression test would be done at [facility], not [facility]  Teresa Hayes

## 2020-02-21 ENCOUNTER — Ambulatory Visit (HOSPITAL_COMMUNITY): Admission: RE | Admit: 2020-02-21 | Payer: 59 | Source: Ambulatory Visit

## 2020-02-24 ENCOUNTER — Other Ambulatory Visit: Payer: Self-pay

## 2020-02-24 ENCOUNTER — Ambulatory Visit (HOSPITAL_COMMUNITY)
Admission: RE | Admit: 2020-02-24 | Discharge: 2020-02-24 | Disposition: A | Discharge status: Home / Self Care | Payer: 59 | Source: Ambulatory Visit | Attending: Cardiology | Admitting: Cardiology

## 2020-02-24 DIAGNOSIS — R072 Precordial pain: Secondary | ICD-10-CM | POA: Diagnosis not present

## 2020-02-24 MED ORDER — NITROGLYCERIN 0.4 MG SL SUBL
SUBLINGUAL_TABLET | SUBLINGUAL | Status: AC
  Filled 2020-02-24: qty 2

## 2020-02-24 MED ORDER — NITROGLYCERIN 0.4 MG SL SUBL
0.8000 mg | SUBLINGUAL_TABLET | Freq: Once | SUBLINGUAL | Status: AC
  Administered 2020-02-24: 08:00:00 0.8 mg via SUBLINGUAL

## 2020-02-24 MED ORDER — METOPROLOL TARTRATE 5 MG/5ML IV SOLN
INTRAVENOUS | Status: AC
  Filled 2020-02-24: qty 15

## 2020-02-24 MED ORDER — IOHEXOL 350 MG/ML SOLN
80.0000 mL | Freq: Once | INTRAVENOUS | Status: AC | PRN
  Administered 2020-02-24: 80 mL via INTRAVENOUS

## 2020-02-24 MED ORDER — METOPROLOL TARTRATE 5 MG/5ML IV SOLN
5.0000 mg | INTRAVENOUS | Status: DC | PRN
  Administered 2020-02-24: 5 mg via INTRAVENOUS

## 2020-02-27 ENCOUNTER — Telehealth: Payer: Self-pay | Admitting: Cardiology

## 2020-02-27 NOTE — Telephone Encounter (Signed)
Called patient, advised that it had not been resulted yet by provider.  Advised we would call once it was reviewed.  Patient verbalized understanding.

## 2020-02-27 NOTE — Telephone Encounter (Signed)
Yes , I will be able to clear her for surgery.

## 2020-02-27 NOTE — Telephone Encounter (Signed)
Patient calling for CT scan results.

## 2020-02-27 NOTE — Telephone Encounter (Signed)
I spoke with patient and reviewed cardiac CT results with her.  She is asking if she can proceed with surgery (see clearance note dated 01/20/20)

## 2020-02-28 ENCOUNTER — Ambulatory Visit: Payer: 59 | Admitting: Cardiology

## 2020-02-28 NOTE — Telephone Encounter (Signed)
   Primary Cardiologist: [name], DO  Chart reviewed as part of pre-operative protocol coverage. Patient underwent a coronary CTA to evaluate chest pain which revealed normal coronary arteries with a calcium score of 0. Per Dr. Harriet Masson, patient is cleared to proceed with surgery without further cardiovascular testing.   I will route this recommendation to the requesting party via Epic fax function and remove from pre-op pool.  Please call with questions.  Abigail Butts, PA-C 02/28/2020, 9:33 AM

## 2020-02-29 ENCOUNTER — Encounter: Payer: Self-pay | Admitting: *Deleted

## 2020-03-06 ENCOUNTER — Other Ambulatory Visit: Payer: Self-pay | Admitting: General Surgery

## 2020-03-19 ENCOUNTER — Encounter: Payer: Self-pay | Admitting: *Deleted

## 2020-03-24 HISTORY — PX: BREAST LUMPECTOMY: SHX2

## 2020-03-29 ENCOUNTER — Telehealth: Payer: Self-pay | Admitting: Oncology

## 2020-03-29 NOTE — Telephone Encounter (Signed)
Spoke with pt and cancelled appt per 5/6 sch msg. Pt did not want to reschedule appt at the moment.

## 2020-04-02 ENCOUNTER — Ambulatory Visit: Payer: 59 | Admitting: Oncology

## 2020-04-02 ENCOUNTER — Other Ambulatory Visit: Payer: Self-pay | Admitting: General Surgery

## 2020-04-02 DIAGNOSIS — C50922 Malignant neoplasm of unspecified site of left male breast: Secondary | ICD-10-CM

## 2020-04-03 ENCOUNTER — Other Ambulatory Visit: Payer: Self-pay | Admitting: General Surgery

## 2020-04-03 DIAGNOSIS — C50922 Malignant neoplasm of unspecified site of left male breast: Secondary | ICD-10-CM

## 2020-04-09 ENCOUNTER — Other Ambulatory Visit (HOSPITAL_COMMUNITY)
Admission: RE | Admit: 2020-04-09 | Discharge: 2020-04-09 | Disposition: A | Discharge status: Home / Self Care | Payer: 59 | Source: Ambulatory Visit | Attending: General Surgery | Admitting: General Surgery

## 2020-04-09 ENCOUNTER — Encounter (HOSPITAL_COMMUNITY): Payer: Self-pay

## 2020-04-09 ENCOUNTER — Other Ambulatory Visit: Payer: Self-pay

## 2020-04-09 ENCOUNTER — Encounter (HOSPITAL_COMMUNITY)
Admission: RE | Admit: 2020-04-09 | Discharge: 2020-04-09 | Disposition: A | Discharge status: Home / Self Care | Payer: 59 | Source: Ambulatory Visit | Attending: General Surgery | Admitting: General Surgery

## 2020-04-09 DIAGNOSIS — Z20822 Contact with and (suspected) exposure to covid-19: Secondary | ICD-10-CM | POA: Insufficient documentation

## 2020-04-09 DIAGNOSIS — Z01812 Encounter for preprocedural laboratory examination: Secondary | ICD-10-CM | POA: Insufficient documentation

## 2020-04-09 HISTORY — DX: Other specified postprocedural states: Z98.890

## 2020-04-09 HISTORY — DX: Nausea with vomiting, unspecified: R11.2

## 2020-04-09 LAB — BASIC METABOLIC PANEL
Anion gap: 9 (ref 5–15)
BUN: 14 mg/dL (ref 6–20)
CO2: 27 mmol/L (ref 22–32)
Calcium: 9.1 mg/dL (ref 8.9–10.3)
Chloride: 102 mmol/L (ref 98–111)
Creatinine, Ser: 0.96 mg/dL (ref 0.44–1.00)
GFR calc Af Amer: >60 mL/min (ref >60)
GFR calc non Af Amer: >60 mL/min (ref >60)
Glucose, Bld: 96 mg/dL (ref 70–99)
Potassium: 4.3 mmol/L (ref 3.5–5.1)
Sodium: 138 mmol/L (ref 135–145)

## 2020-04-09 LAB — CBC
HCT: 39.9 % (ref 36.0–46.0)
Hemoglobin: 12.4 g/dL (ref 12.0–15.0)
MCH: 31.3 pg (ref 26.0–34.0)
MCHC: 31.1 g/dL (ref 30.0–36.0)
MCV: 100.8 fL — ABNORMAL HIGH (ref 80.0–100.0)
Platelets: 328 10*3/uL (ref 150–400)
RBC: 3.96 MIL/uL (ref 3.87–5.11)
RDW: 14.5 % (ref 11.5–15.5)
WBC: 9.2 10*3/uL (ref 4.0–10.5)
nRBC: 0 % (ref 0.0–0.2)

## 2020-04-09 NOTE — Progress Notes (Signed)
Your procedure is scheduled on Thursday May 20.  Report to [facility] Main Entrance "A" at 05:30 A.M., and check in at the Admitting office.  Call this number if you have problems the morning of surgery: [phone]  Call [phone] if you have any questions prior to your surgery date Monday-Friday 8am-4pm   Remember: Do not eat after midnight the night before your surgery  You may drink clear liquids until 04:30 the morning of your surgery.   Clear liquids allowed are: Water, Non-Citrus Juices (without pulp), Carbonated Beverages, Clear Tea, Black Coffee Only, and Gatorade  Please complete your PRE-SURGERY ENSURE that was provided to you by 04:30 morning of surgery. Please, if able, drink it in one setting. DO NOT SIP.    Take these medicines the morning of surgery with A SIP OF WATER: NONE   As of today, STOP taking any Aspirin (unless otherwise instructed by your surgeon), Aleve, Naproxen, Ibuprofen, Motrin, Advil, Goody's, BC's, all herbal medications, fish oil, and all vitamins.    The Morning of Surgery  Do not wear jewelry, make-up or nail polish.  Do not wear lotions, powders, or perfumes, or deodorant  Do not shave 48 hours prior to surgery.    Do not bring valuables to the hospital.  [facility] is not responsible for any belongings or valuables.  If you are a smoker, DO NOT Smoke 24 hours prior to surgery  If you wear a CPAP at night please bring your mask the morning of surgery   Remember that you must have someone to transport you home after your surgery, and remain with you for 24 hours if you are discharged the same day.   Please bring cases for contacts, glasses, hearing aids, dentures or bridgework because it cannot be worn into surgery.    Leave your suitcase in the car.  After surgery it may be brought to your room.  For patients admitted to the hospital, discharge time will be determined by your treatment team.  Patients discharged the day of  surgery will not be allowed to drive home.    Special instructions:   [place]- Preparing For Surgery  Before surgery, you can play an important role. Because skin is not sterile, your skin needs to be as free of germs as possible. You can reduce the number of germs on your skin by washing with CHG (chlorahexidine gluconate) Soap before surgery.  CHG is an antiseptic cleaner which kills germs and bonds with the skin to continue killing germs even after washing.    Oral Hygiene is also important to reduce your risk of infection.  Remember - BRUSH YOUR TEETH THE MORNING OF SURGERY WITH YOUR REGULAR TOOTHPASTE  Please do not use if you have an allergy to CHG or antibacterial soaps. If your skin becomes reddened/irritated stop using the CHG.  Do not shave (including legs and underarms) for at least 48 hours prior to first CHG shower. It is OK to shave your face.  Please follow these instructions carefully.   1. Shower the NIGHT BEFORE SURGERY and the MORNING OF SURGERY with CHG Soap.   2. If you chose to wash your hair and body, wash as usual with your normal shampoo and body-wash/soap.  3. Rinse your hair and body thoroughly to remove the shampoo and soap.  4. Apply CHG directly to the skin (ONLY FROM THE NECK DOWN) and wash gently with a scrungie or a clean washcloth.   5. Do not use on  open wounds or open sores. Avoid contact with your eyes, ears, mouth and genitals (private parts). Wash Face and genitals (private parts)  with your normal soap.   6. Wash thoroughly, paying special attention to the area where your surgery will be performed.  7. Thoroughly rinse your body with warm water from the neck down.  8. DO NOT shower/wash with your normal soap after using and rinsing off the CHG Soap.  9. Pat yourself dry with a CLEAN TOWEL.  10. Wear CLEAN PAJAMAS to bed the night before surgery  11. Place CLEAN SHEETS on your bed the night of your first shower and DO NOT SLEEP WITH  PETS.  12. Wear comfortable clothes the morning of surgery.     Day of Surgery:  Please shower the morning of surgery with the CHG soap Do not apply any deodorants/lotions. Please wear clean clothes to the hospital/surgery center.   Remember to brush your teeth WITH YOUR REGULAR TOOTHPASTE.   Please read over the following fact sheets that you were given.

## 2020-04-09 NOTE — Progress Notes (Signed)
PCP Garlon Hatchet, MD Cardiologist - Tobb, MD  PPM/ICD - n/a   Chest x-ray - n/a EKG - 09/12/19 Stress Test - pt states >10 years ago  ECHO - 11/14/19 Cardiac Cath - pt denies  Sleep Study - yes CPAP - yes  Pt not diabetic  Blood Thinner Instructions:n/a Aspirin Instructions:n/a  ERAS Protcol - yes PRE-SURGERY Ensure or G2- ensure  COVID TEST- 04/09/20  Coronavirus Screening  Have you experienced the following symptoms:  Cough yes/no: No Fever (>100.49F)  yes/no: No Runny nose yes/no: No Sore throat yes/no: No Difficulty breathing/shortness of breath  yes/no: No  Have you or a family member traveled in the last 14 days and where? yes/no: No   If the patient indicates "YES" to the above questions, their PAT will be rescheduled to limit the exposure to others and, the surgeon will be notified. THE PATIENT WILL NEED TO BE ASYMPTOMATIC FOR 14 DAYS.   If the patient is not experiencing any of these symptoms, the PAT nurse will instruct them to NOT bring anyone with them to their appointment since they may have these symptoms or traveled as well.   Please remind your patients and families that hospital visitation restrictions are in effect and the importance of the restrictions.     Anesthesia review: yes, pt >350lbs  Patient denies shortness of breath, fever, cough and chest pain at PAT appointment   All instructions explained to the patient, with a verbal understanding of the material. Patient agrees to go over the instructions while at home for a better understanding. Patient also instructed to self quarantine after being tested for COVID-19. The opportunity to ask questions was provided.

## 2020-04-10 ENCOUNTER — Ambulatory Visit
Admission: RE | Admit: 2020-04-10 | Discharge: 2020-04-10 | Disposition: A | Discharge status: Home / Self Care | Payer: 59 | Source: Ambulatory Visit | Attending: General Surgery | Admitting: General Surgery

## 2020-04-10 DIAGNOSIS — C50922 Malignant neoplasm of unspecified site of left male breast: Secondary | ICD-10-CM

## 2020-04-10 LAB — SARS CORONAVIRUS 2 (TAT 6-24 HRS): SARS Coronavirus 2: NEGATIVE

## 2020-04-10 NOTE — H&P (Signed)
[facility] Appointment: 03/19/2020 2:00 PM Location: [facility] Patient #: [id number] DOB: December 16, 1965 Divorced / Language: [facility] / Race: White Female   History of Present Illness Teresa Klein MD; 03/19/2020 2:55 PM) The patient is a 54 year old female who presents for a follow-up for Breast cancer. Pt is a lovely 54 yo F referred by Dr. Simona Hayes for consultation for new left breast cancer 07/2019. The patient presented with a palpable left breast mass for several months. The mass became more tender and prominent so the patient sought medical attention. Diagnostic imaging was performed demonstrating a mass wtih architectural distortion in the lower outer quadrant of the left breast. 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   She had no other personal history of cancer. She does have severe bilateral lower extremity lymphedema. She had menarche at age 65. She is no longer having periods. She used HRT for several years, but none for over 1 year. She is a G2P2 with first child at age 21. She has not ever had a colonoscopy or a bone density study. Last pap was 2018.   Her maternal grandmother had pancreatic cancer and maternal grandfather had some type of digestive cancer. 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   She had been taking anastrozole but stopped due to tachycardia and angina. These were improved post stopping. She had an MR and had to get one additional biopsy that was OK. She has had cardiac workup and medication optimization. She is doing better and ready for Hayes. The mass is still palpable.   Dx mammo/us 08/17/2019 CLINICAL DATA: Tender palpable abnormality in the LEFT breast for 6 months.  EXAM: DIGITAL DIAGNOSTIC BILATERAL MAMMOGRAM WITH CAD AND TOMO  ULTRASOUND LEFT BREAST  COMPARISON: 08/21/2017 and earlier  ACR Breast Density  Category b: There are scattered areas of fibroglandular density.  FINDINGS: Within the LOWER OUTER QUADRANT of the LEFT breast there is a focal area of distortion, marked as the area of patient's concern with BB. RIGHT breast is negative.  Mammographic images were processed with CAD.  On physical exam, I palpate a discrete firm mass in the 4 o'clock location of the LEFT breast near the nipple.  Targeted ultrasound is performed, showing irregular hypoechoic palpable mass in the 3:30 o'clock location of the LEFT breast. Mass is associated with posterior acoustic shadowing and is estimated to measure 2.1 x 1.5 x 1.6 centimeters. Evaluation of the LEFT axilla is negative for adenopathy.  IMPRESSION: Suspicious mass in the LEFT breast 3:30 o'clock location.  RECOMMENDATION: Ultrasound-guided core biopsy of LEFT breast mass.  I have discussed the findings and recommendations with the patient. If applicable, a reminder letter will be sent to the patient regarding the next appointment.  BI-RADS CATEGORY 4: Suspicious.  pathology 08/22/2019 Breast, left, needle core biopsy, mass, lower outer quadrant, 3:30 o'clock - INVASIVE MAMMARY CARCINOMA. The carcinoma appears grade I-II. The tumor cells are negative for e-cadherin, supporting a lobular phenotype The tumor cells are NEGATIVE for Her2 (1+). Estrogen Receptor: 100%, POSITIVE, STRONG STAINING INTENSITY Progesterone Receptor: 100%, POSITIVE, STRONG STAINING INTENSITY Proliferation Marker Ki67: 10%  CBC, CMET essentially normal 09/07/2019   Allergies Teresa [facility], CMA; 03/19/2020 1:53 PM) Shellfish  Rash. traMADol HCl *ANALGESICS - OPIOID*  Itching. Allergies Reconciled   Medication History Teresa [facility], CMA; 03/19/2020 1:53 PM) Lisinopril (20MG Tablet, Oral) Active. Lisinopril-HCTZ (25-20MG Tablet, Oral) Active. Medications Reconciled  Review of Systems Teresa Klein MD; 03/19/2020 2:55 PM) All other  systems negative  Vitals Teresa Hayes CMA; 03/19/2020 1:53 PM) 03/19/2020 1:52 PM Weight: 365.6 lb Height: 66in Body Surface Area: 2.58 m Body Mass Index: 59.01 kg/m  Temp.: 98.38F  Pulse: 114 (Regular)  BP: 126/78(Sitting, Left Arm, Standard)       Physical Exam Teresa Klein MD; 03/19/2020 2:55 PM) General Mental Status-Alert. General Appearance-Consistent with stated age. Hydration-Well hydrated. Voice-Normal.  Head and Neck Head-normocephalic, atraumatic with no lesions or palpable masses.  Eye Sclera/Conjunctiva - Bilateral-No scleral icterus.  Chest and Lung Exam Chest and lung exam reveals -quiet, even and easy respiratory effort with no use of accessory muscles. Inspection Chest Wall - Normal. Back - normal.  Breast Note: around 1.5 cm mass LOQ left breast. mobile. no nipple retraction or skin dimpling. No LAD. no other findings.   Cardiovascular Cardiovascular examination reveals -normal pedal pulses bilaterally. Note: regular rate and rhythm  Abdomen Inspection-Inspection Normal. Palpation/Percussion Palpation and Percussion of the abdomen reveal - Soft, Non Tender, No Rebound tenderness, No Rigidity (guarding) and No hepatosplenomegaly.  Peripheral Vascular Upper Extremity Inspection - Bilateral - Normal - No Clubbing, No Cyanosis, No Edema, Pulses Intact. Lower Extremity Palpation - Edema - Bilateral - No edema - Bilateral.  Neurologic Neurologic evaluation reveals -alert and oriented x 3 with no impairment of recent or remote memory. Mental Status-Normal.  Musculoskeletal Global Assessment -Note: no gross deformities.  Normal Exam - Left-Upper Extremity Strength Normal and Lower Extremity Strength Normal. Normal Exam - Right-Upper Extremity Strength Normal and Lower Extremity Strength Normal.  Lymphatic Head & Neck  General Head & Neck Lymphatics: Bilateral - Description -  Normal. Axillary  General Axillary Region: Bilateral - Description - Normal. Tenderness - Non Tender.    Assessment & Plan Teresa Klein MD; 03/19/2020 2:57 PM) MALIGNANT NEOPLASM OF LOWER-OUTER QUADRANT OF LEFT BREAST OF FEMALE, ESTROGEN RECEPTOR POSITIVE (C50.512) Impression: Will plan lumpectomy with sentinel node biopsy. This will presumably be followed by oncotype and radiation. Presumably oncology will discuss other antihormonal tx post XRT.  The mass was not seen on mammo, so she will need annual ultrasound vs MRI.  The surgical procedure was described to the patient. I discussed the incision type and location and that we would need radiology involved on with a wire or seed marker and/or sentinel node.  The risks and benefits of the procedure were described to the patient and she wishes to proceed.  We discussed the risks bleeding, infection, damage to other structures, need for further procedures/surgeries. We discussed the risk of seroma. The patient was advised if the area in the breast in cancer, we may need to go back to [facility] for additional tissue to obtain negative margins or for a lymph node biopsy. The patient was advised that these are the most common complications, but that others can occur as well. They were advised against taking aspirin or other anti-inflammatory agents/blood thinners the week before Hayes. Current Plans Pt Education - flb breast cancer Hayes: discussed with patient and provided information. LYMPHEDEMA OF BOTH LOWER EXTREMITIES (I89.0) Impression: Should not change our tx, but more likely to develop UE lymphedema due to weight.    Signed electronically by Teresa Klein, MD (03/19/2020 2:58 PM)

## 2020-04-10 NOTE — Anesthesia Preprocedure Evaluation (Addendum)
Anesthesia Evaluation  Patient identified by MRN, date of birth, ID band Patient awake    Reviewed: Allergy & Precautions, NPO status , Patient's Chart, lab work & pertinent test results  History of Anesthesia Complications (+) PONV and history of anesthetic complications  Airway Mallampati: III  TM Distance: >3 FB Neck ROM: Full    Dental  (+) Missing,    Pulmonary neg pulmonary ROS,    Pulmonary exam normal        Cardiovascular hypertension, Pt. on medications Normal cardiovascular exam     Neuro/Psych negative neurological ROS  negative psych ROS   GI/Hepatic negative GI ROS, Neg liver ROS,   Endo/Other  Morbid obesity  Renal/GU negative Renal ROS  negative genitourinary   Musculoskeletal  (+) Arthritis ,   Abdominal   Peds  Hematology negative hematology ROS (+)   Anesthesia Other Findings Left breast ca  Reproductive/Obstetrics negative OB ROS                           Anesthesia Physical Anesthesia Plan  ASA: III  Anesthesia Plan: General   Post-op Pain Management: GA combined w/ Regional for post-op pain   Induction: Intravenous  PONV Risk Score and Plan: 4 or greater and Treatment may vary due to age or medical condition, Ondansetron, Dexamethasone, Midazolam and Scopolamine patch - Pre-op  Airway Management Planned: Oral ETT  Additional Equipment: None  Intra-op Plan:   Post-operative Plan: Extubation in OR  Informed Consent: I have reviewed the patients History and Physical, chart, labs and discussed the procedure including the risks, benefits and alternatives for the proposed anesthesia with the patient or authorized representative who has indicated his/her understanding and acceptance.     Dental advisory given  Plan Discussed with: CRNA  Anesthesia Plan Comments: (Pt had recent reassuring cardiac workup for report of chest pain. Clearance per telephone  encounter 02/28/20, "Chart reviewed as part of pre-operative protocol coverage. Patient underwent a coronary CTA to evaluate chest pain which revealed normal coronary arteries with a calcium score of 0. Per Dr. Harriet Masson, patient is cleared to proceed with surgery without further cardiovascular testing."  Preop labs reviewed, unremarkable.  Coronary CT 02/24/20: IMPRESSION: 1. Coronary calcium score of 0. This was 0 percentile for age and sex matched control.  2. Normal coronary origin with right dominance.  3. No evidence of CAD.  TTE 11/14/19: 1. Grossly normal echocardiogram.  2. Left ventricular ejection fraction, by visual estimation, is 60 to  65%. The left ventricle has normal function. There is borderline left  ventricular hypertrophy.  3. Left ventricular diastolic parameters are consistent with Grade I  diastolic dysfunction (impaired relaxation).  4. The left ventricle has no regional wall motion abnormalities.  5. Global right ventricle has normal systolic function.The right  ventricular size is normal. No increase in right ventricular wall  thickness.  6. Left atrial size was normal.  7. Right atrial size was normal.  8. The mitral valve is normal in structure. No evidence of mitral valve  regurgitation. No evidence of mitral stenosis.  9. The tricuspid valve is normal in structure. Tricuspid valve  regurgitation is not demonstrated.  10. The aortic valve is normal in structure. Aortic valve regurgitation is  not visualized. No evidence of aortic valve sclerosis or stenosis.    Event monitor 10/07/19: The minimum heart rate was 48 bpm and the maximum heart rate was 141 bpm and the average heart rate was 76  bpm. The predominant rhythm was sinus.  1 run of  Supraventricular Tachycardia occurred lasting 5 beats with a maximum rate of 141 bpm (avg 127 bpm). Premature atrial complexes were rare (<1%). Premature ventricular complexes were rare (<1%).  14 patient  triggered events were noted, 1 was associated with premature atrial complexes the remaining of the triggered events were associated with sinus rhythm.  No ventricular tachycardia, No Pause, No AV block and no atrial fibrillation present.  Conclusion: This study is remarkable for 1 asymptomatic run of supraventricular tachycardia which is likely paroxysmal atrial tachycardia with variable block.)      Anesthesia Quick Evaluation

## 2020-04-10 NOTE — Progress Notes (Addendum)
Anesthesia Chart Review:  Pt had recent reassuring cardiac workup for report of chest pain. Clearance per telephone encounter 02/28/20, "Chart reviewed as part of pre-operative protocol coverage. Patient underwent a coronary CTA to evaluate chest pain which revealed normal coronary arteries with a calcium score of 0. Per Dr. Harriet Masson, patient is cleared to proceed with surgery without further cardiovascular testing."  Preop labs reviewed, unremarkable.  Coronary CT 02/24/20: IMPRESSION: 1. Coronary calcium score of 0. This was 0 percentile for age and sex matched control.  2. Normal coronary origin with right dominance.  3. No evidence of CAD.  TTE 11/14/19: 1. Grossly normal echocardiogram.  2. Left ventricular ejection fraction, by visual estimation, is 60 to  65%. The left ventricle has normal function. There is borderline left  ventricular hypertrophy.  3. Left ventricular diastolic parameters are consistent with Grade I  diastolic dysfunction (impaired relaxation).  4. The left ventricle has no regional wall motion abnormalities.  5. Global right ventricle has normal systolic function.The right  ventricular size is normal. No increase in right ventricular wall  thickness.  6. Left atrial size was normal.  7. Right atrial size was normal.  8. The mitral valve is normal in structure. No evidence of mitral valve  regurgitation. No evidence of mitral stenosis.  9. The tricuspid valve is normal in structure. Tricuspid valve  regurgitation is not demonstrated.  10. The aortic valve is normal in structure. Aortic valve regurgitation is  not visualized. No evidence of aortic valve sclerosis or stenosis.    Event monitor 10/07/19: The minimum heart rate was 48 bpm and the maximum heart rate was 141 bpm and the average heart rate was 76 bpm. The predominant rhythm was sinus.  1 run of  Supraventricular Tachycardia occurred lasting 5 beats with a maximum rate of 141 bpm (avg 127  bpm). Premature atrial complexes were rare (<1%). Premature ventricular complexes were rare (<1%).  14 patient triggered events were noted, 1 was associated with premature atrial complexes the remaining of the triggered events were associated with sinus rhythm.  No ventricular tachycardia, No Pause, No AV block and no atrial fibrillation present.  Conclusion: This study is remarkable for 1 asymptomatic run of supraventricular tachycardia which is likely paroxysmal atrial tachycardia with variable block.   Wynonia Musty [facility] Short Stay Center/Anesthesiology Phone [phone] 04/10/2020 3:34 PM

## 2020-04-11 MED ORDER — DEXTROSE 5 % IV SOLN
3.0000 g | INTRAVENOUS | Status: AC
  Administered 2020-04-12: 3 g via INTRAVENOUS
  Filled 2020-04-11: qty 3
  Filled 2020-04-11: qty 3000

## 2020-04-12 ENCOUNTER — Ambulatory Visit
Admission: RE | Admit: 2020-04-12 | Discharge: 2020-04-12 | Disposition: A | Discharge status: Home / Self Care | Payer: 59 | Source: Ambulatory Visit | Attending: General Surgery | Admitting: General Surgery

## 2020-04-12 ENCOUNTER — Other Ambulatory Visit: Payer: Self-pay

## 2020-04-12 ENCOUNTER — Ambulatory Visit (HOSPITAL_COMMUNITY)
Admission: RE | Admit: 2020-04-12 | Discharge: 2020-04-12 | Disposition: A | Discharge status: Home / Self Care | Payer: 59 | Attending: General Surgery | Admitting: General Surgery

## 2020-04-12 ENCOUNTER — Ambulatory Visit (HOSPITAL_COMMUNITY): Payer: 59 | Admitting: Physician Assistant

## 2020-04-12 ENCOUNTER — Ambulatory Visit (HOSPITAL_COMMUNITY): Payer: 59 | Admitting: Anesthesiology

## 2020-04-12 ENCOUNTER — Encounter (HOSPITAL_COMMUNITY): Payer: Self-pay | Admitting: General Surgery

## 2020-04-12 ENCOUNTER — Encounter (HOSPITAL_COMMUNITY)
Admission: RE | Admit: 2020-04-12 | Discharge: 2020-04-12 | Disposition: A | Discharge status: Home / Self Care | Payer: 59 | Source: Ambulatory Visit | Attending: General Surgery | Admitting: General Surgery

## 2020-04-12 ENCOUNTER — Encounter (HOSPITAL_COMMUNITY)
Admission: RE | Disposition: A | Discharge status: Home / Self Care | Payer: Self-pay | Source: Home / Self Care | Attending: General Surgery

## 2020-04-12 DIAGNOSIS — I890 Lymphedema, not elsewhere classified: Secondary | ICD-10-CM | POA: Insufficient documentation

## 2020-04-12 DIAGNOSIS — Z79899 Other long term (current) drug therapy: Secondary | ICD-10-CM | POA: Insufficient documentation

## 2020-04-12 DIAGNOSIS — I10 Essential (primary) hypertension: Secondary | ICD-10-CM | POA: Insufficient documentation

## 2020-04-12 DIAGNOSIS — C50922 Malignant neoplasm of unspecified site of left male breast: Secondary | ICD-10-CM

## 2020-04-12 DIAGNOSIS — Z6841 Body Mass Index (BMI) 40.0 and over, adult: Secondary | ICD-10-CM | POA: Insufficient documentation

## 2020-04-12 DIAGNOSIS — M1990 Unspecified osteoarthritis, unspecified site: Secondary | ICD-10-CM | POA: Insufficient documentation

## 2020-04-12 DIAGNOSIS — C50512 Malignant neoplasm of lower-outer quadrant of left female breast: Secondary | ICD-10-CM

## 2020-04-12 DIAGNOSIS — C50912 Malignant neoplasm of unspecified site of left female breast: Secondary | ICD-10-CM | POA: Diagnosis not present

## 2020-04-12 HISTORY — PX: SENTINEL NODE BIOPSY: SHX6608

## 2020-04-12 HISTORY — PX: BREAST LUMPECTOMY WITH RADIOACTIVE SEED AND SENTINEL LYMPH NODE BIOPSY: SHX6550

## 2020-04-12 SURGERY — BREAST LUMPECTOMY WITH RADIOACTIVE SEED AND SENTINEL LYMPH NODE BIOPSY
Anesthesia: General | Site: Breast | Laterality: Left

## 2020-04-12 MED ORDER — OXYCODONE HCL 5 MG/5ML PO SOLN: 5.0000 mg | Freq: Once | ORAL | Status: AC | PRN

## 2020-04-12 MED ORDER — MIDAZOLAM HCL 5 MG/5ML IJ SOLN
INTRAMUSCULAR | Status: DC | PRN
  Administered 2020-04-12: 2 mg via INTRAVENOUS

## 2020-04-12 MED ORDER — SUCCINYLCHOLINE CHLORIDE 200 MG/10ML IV SOSY
PREFILLED_SYRINGE | INTRAVENOUS | Status: DC | PRN
  Administered 2020-04-12: 200 mg via INTRAVENOUS

## 2020-04-12 MED ORDER — BUPIVACAINE-EPINEPHRINE (PF) 0.5% -1:200000 IJ SOLN
INTRAMUSCULAR | Status: DC | PRN
Start: 2020-04-12 — End: 2020-04-12

## 2020-04-12 MED ORDER — FENTANYL CITRATE (PF) 100 MCG/2ML IJ SOLN
25.0000 ug | INTRAMUSCULAR | Status: DC | PRN
  Administered 2020-04-12 (×3): 50 ug via INTRAVENOUS

## 2020-04-12 MED ORDER — BUPIVACAINE-EPINEPHRINE (PF) 0.5% -1:200000 IJ SOLN
INTRAMUSCULAR | Status: DC | PRN
Start: 2020-04-12 — End: 2020-04-12
  Administered 2020-04-12: 20 mL via PERINEURAL

## 2020-04-12 MED ORDER — ENSURE PRE-SURGERY PO LIQD: 296.0000 mL | Freq: Once | ORAL | Status: DC

## 2020-04-12 MED ORDER — HYDROMORPHONE HCL 1 MG/ML IJ SOLN
0.2500 mg | INTRAMUSCULAR | Status: DC | PRN
  Administered 2020-04-12 (×4): 0.5 mg via INTRAVENOUS

## 2020-04-12 MED ORDER — OXYCODONE HCL 5 MG PO TABS
5.0000 mg | ORAL_TABLET | Freq: Once | ORAL | Status: AC | PRN
  Administered 2020-04-12: 5 mg via ORAL

## 2020-04-12 MED ORDER — LIDOCAINE 2% (20 MG/ML) 5 ML SYRINGE
INTRAMUSCULAR | Status: DC | PRN
  Administered 2020-04-12: 100 mg via INTRAVENOUS

## 2020-04-12 MED ORDER — HYDROMORPHONE HCL 1 MG/ML IJ SOLN
INTRAMUSCULAR | Status: AC
  Filled 2020-04-12: qty 1

## 2020-04-12 MED ORDER — TECHNETIUM TC 99M SULFUR COLLOID FILTERED
1.0000 | Freq: Once | INTRAVENOUS | Status: AC | PRN
  Administered 2020-04-12: 1 via INTRADERMAL

## 2020-04-12 MED ORDER — PROMETHAZINE HCL 25 MG/ML IJ SOLN: 6.2500 mg | INTRAMUSCULAR | Status: DC | PRN

## 2020-04-12 MED ORDER — LIDOCAINE-EPINEPHRINE 1 %-1:100000 IJ SOLN
INTRAMUSCULAR | Status: DC | PRN
  Administered 2020-04-12: 15 mL

## 2020-04-12 MED ORDER — OXYCODONE HCL 5 MG PO TABS
ORAL_TABLET | ORAL | Status: AC
  Filled 2020-04-12: qty 1

## 2020-04-12 MED ORDER — FENTANYL CITRATE (PF) 100 MCG/2ML IJ SOLN
INTRAMUSCULAR | Status: DC | PRN
  Administered 2020-04-12 (×3): 50 ug via INTRAVENOUS

## 2020-04-12 MED ORDER — PHENYLEPHRINE 40 MCG/ML (10ML) SYRINGE FOR IV PUSH (FOR BLOOD PRESSURE SUPPORT)
PREFILLED_SYRINGE | INTRAVENOUS | Status: DC | PRN
  Administered 2020-04-12: 40 ug via INTRAVENOUS
  Administered 2020-04-12 (×2): 120 ug via INTRAVENOUS
  Administered 2020-04-12: 40 ug via INTRAVENOUS

## 2020-04-12 MED ORDER — ROCURONIUM BROMIDE 10 MG/ML (PF) SYRINGE
PREFILLED_SYRINGE | INTRAVENOUS | Status: DC | PRN
  Administered 2020-04-12: 20 mg via INTRAVENOUS

## 2020-04-12 MED ORDER — LIDOCAINE-EPINEPHRINE 1 %-1:100000 IJ SOLN
INTRAMUSCULAR | Status: AC
  Filled 2020-04-12: qty 3

## 2020-04-12 MED ORDER — CHLORHEXIDINE GLUCONATE CLOTH 2 % EX PADS: 6.0000 | MEDICATED_PAD | Freq: Once | CUTANEOUS | Status: DC

## 2020-04-12 MED ORDER — 0.9 % SODIUM CHLORIDE (POUR BTL) OPTIME
TOPICAL | Status: DC | PRN
  Administered 2020-04-12: 1000 mL

## 2020-04-12 MED ORDER — BUPIVACAINE HCL (PF) 0.25 % IJ SOLN
INTRAMUSCULAR | Status: DC | PRN
  Administered 2020-04-12: 15 mL

## 2020-04-12 MED ORDER — SUGAMMADEX SODIUM 200 MG/2ML IV SOLN
INTRAVENOUS | Status: DC | PRN
  Administered 2020-04-12: 300 mg via INTRAVENOUS

## 2020-04-12 MED ORDER — PROPOFOL 10 MG/ML IV BOLUS
INTRAVENOUS | Status: DC | PRN
  Administered 2020-04-12: 20 mg via INTRAVENOUS
  Administered 2020-04-12: 200 mg via INTRAVENOUS

## 2020-04-12 MED ORDER — MIDAZOLAM HCL 2 MG/2ML IJ SOLN
INTRAMUSCULAR | Status: AC
  Filled 2020-04-12: qty 2

## 2020-04-12 MED ORDER — LACTATED RINGERS IV SOLN
INTRAVENOUS | Status: DC | PRN
Start: 2020-04-12 — End: 2020-04-12

## 2020-04-12 MED ORDER — METHYLENE BLUE 0.5 % INJ SOLN
INTRAVENOUS | Status: AC
  Filled 2020-04-12: qty 10

## 2020-04-12 MED ORDER — OXYCODONE HCL 5 MG PO TABS: 5.0000 mg | ORAL_TABLET | Freq: Four times a day (QID) | ORAL | 0 refills | Status: DC | PRN

## 2020-04-12 MED ORDER — PROPOFOL 10 MG/ML IV BOLUS
INTRAVENOUS | Status: AC
  Filled 2020-04-12: qty 40

## 2020-04-12 MED ORDER — FENTANYL CITRATE (PF) 100 MCG/2ML IJ SOLN
INTRAMUSCULAR | Status: AC
  Filled 2020-04-12: qty 2

## 2020-04-12 MED ORDER — BUPIVACAINE LIPOSOME 1.3 % IJ SUSP
INTRAMUSCULAR | Status: DC | PRN
  Administered 2020-04-12: 10 mL via PERINEURAL

## 2020-04-12 MED ORDER — PHENYLEPHRINE HCL-NACL 10-0.9 MG/250ML-% IV SOLN
INTRAVENOUS | Status: DC | PRN
  Administered 2020-04-12: 75 ug/min via INTRAVENOUS

## 2020-04-12 MED ORDER — ACETAMINOPHEN 500 MG PO TABS
1000.0000 mg | ORAL_TABLET | Freq: Once | ORAL | Status: AC
  Administered 2020-04-12: 1000 mg via ORAL
  Filled 2020-04-12: qty 2

## 2020-04-12 MED ORDER — ONDANSETRON HCL 4 MG/2ML IJ SOLN
INTRAMUSCULAR | Status: DC | PRN
  Administered 2020-04-12: 4 mg via INTRAVENOUS

## 2020-04-12 MED ORDER — DEXAMETHASONE SODIUM PHOSPHATE 10 MG/ML IJ SOLN
INTRAMUSCULAR | Status: DC | PRN
  Administered 2020-04-12: 5 mg via INTRAVENOUS

## 2020-04-12 MED ORDER — FENTANYL CITRATE (PF) 250 MCG/5ML IJ SOLN
INTRAMUSCULAR | Status: AC
  Filled 2020-04-12: qty 5

## 2020-04-12 MED ORDER — BUPIVACAINE HCL (PF) 0.25 % IJ SOLN
INTRAMUSCULAR | Status: AC
  Filled 2020-04-12: qty 30

## 2020-04-12 MED ORDER — SCOPOLAMINE 1 MG/3DAYS TD PT72
1.0000 | MEDICATED_PATCH | Freq: Once | TRANSDERMAL | Status: DC
  Administered 2020-04-12: 1.5 mg via TRANSDERMAL
  Filled 2020-04-12: qty 1

## 2020-04-12 MED ORDER — SODIUM CHLORIDE (PF) 0.9 % IJ SOLN
INTRAMUSCULAR | Status: AC
  Filled 2020-04-12: qty 10

## 2020-04-12 SURGICAL SUPPLY — 49 items
ADH SKN CLS APL DERMABOND .7 (GAUZE/BANDAGES/DRESSINGS) ×2
APL PRP STRL LF DISP 70% ISPRP (MISCELLANEOUS) ×2
BINDER BREAST LRG (GAUZE/BANDAGES/DRESSINGS) IMPLANT
BINDER BREAST XLRG (GAUZE/BANDAGES/DRESSINGS) IMPLANT
BINDER BREAST XXLRG (GAUZE/BANDAGES/DRESSINGS) ×1 IMPLANT
BNDG COHESIVE 4X5 TAN STRL (GAUZE/BANDAGES/DRESSINGS) ×3 IMPLANT
CANISTER SUCT 3000ML PPV (MISCELLANEOUS) ×3 IMPLANT
CHLORAPREP W/TINT 26 (MISCELLANEOUS) ×3 IMPLANT
CLIP VESOCCLUDE LG 6/CT (CLIP) ×3 IMPLANT
CLIP VESOCCLUDE MED 6/CT (CLIP) ×3 IMPLANT
CLIP VESOCCLUDE SM WIDE 6/CT (CLIP) ×3 IMPLANT
CNTNR URN SCR LID CUP LEK RST (MISCELLANEOUS) IMPLANT
CONT SPEC 4OZ STRL OR WHT (MISCELLANEOUS)
COVER PROBE W GEL 5X96 (DRAPES) ×3 IMPLANT
COVER SURGICAL LIGHT HANDLE (MISCELLANEOUS) ×3 IMPLANT
COVER WAND RF STERILE (DRAPES) ×3 IMPLANT
DERMABOND ADVANCED (GAUZE/BANDAGES/DRESSINGS) ×1
DERMABOND ADVANCED .7 DNX12 (GAUZE/BANDAGES/DRESSINGS) ×2 IMPLANT
DEVICE DUBIN SPECIMEN MAMMOGRA (MISCELLANEOUS) IMPLANT
DRAPE CHEST BREAST 15X10 FENES (DRAPES) ×3 IMPLANT
ELECT COATED BLADE 2.86 ST (ELECTRODE) ×3 IMPLANT
ELECT NDL BLADE 2-5/6 (NEEDLE) ×2 IMPLANT
ELECT NEEDLE BLADE 2-5/6 (NEEDLE) ×3 IMPLANT
ELECT REM PT RETURN 9FT ADLT (ELECTROSURGICAL) ×3
ELECTRODE REM PT RTRN 9FT ADLT (ELECTROSURGICAL) ×2 IMPLANT
GLOVE BIO SURGEON STRL SZ 6 (GLOVE) ×3 IMPLANT
GLOVE INDICATOR 6.5 STRL GRN (GLOVE) ×3 IMPLANT
GOWN STRL REUS W/ TWL LRG LVL3 (GOWN DISPOSABLE) ×2 IMPLANT
GOWN STRL REUS W/TWL 2XL LVL3 (GOWN DISPOSABLE) ×3 IMPLANT
GOWN STRL REUS W/TWL LRG LVL3 (GOWN DISPOSABLE) ×3
KIT BASIN OR (CUSTOM PROCEDURE TRAY) ×3 IMPLANT
KIT MARKER MARGIN INK (KITS) ×3 IMPLANT
LIGHT WAVEGUIDE WIDE FLAT (MISCELLANEOUS) IMPLANT
NDL 18GX1X1/2 (RX/OR ONLY) (NEEDLE) IMPLANT
NDL FILTER BLUNT 18X1 1/2 (NEEDLE) IMPLANT
NDL HYPO 25GX1X1/2 BEV (NEEDLE) ×2 IMPLANT
NEEDLE 18GX1X1/2 (RX/OR ONLY) (NEEDLE) IMPLANT
NEEDLE FILTER BLUNT 18X 1/2SAF (NEEDLE)
NEEDLE FILTER BLUNT 18X1 1/2 (NEEDLE) IMPLANT
NEEDLE HYPO 25GX1X1/2 BEV (NEEDLE) ×3 IMPLANT
NS IRRIG 1000ML POUR BTL (IV SOLUTION) ×3 IMPLANT
PACK GENERAL/GYN (CUSTOM PROCEDURE TRAY) ×3 IMPLANT
PACK UNIVERSAL I (CUSTOM PROCEDURE TRAY) ×3 IMPLANT
STOCKINETTE IMPERVIOUS 9X36 MD (GAUZE/BANDAGES/DRESSINGS) ×3 IMPLANT
SUT MNCRL AB 4-0 PS2 18 (SUTURE) ×3 IMPLANT
SUT VIC AB 3-0 SH 8-18 (SUTURE) ×3 IMPLANT
SYR CONTROL 10ML LL (SYRINGE) ×3 IMPLANT
TOWEL GREEN STERILE (TOWEL DISPOSABLE) ×3 IMPLANT
TOWEL GREEN STERILE FF (TOWEL DISPOSABLE) ×3 IMPLANT

## 2020-04-12 NOTE — Anesthesia Procedure Notes (Signed)
Anesthesia Regional Block: Pectoralis block   Pre-Anesthetic Checklist: ,, timeout performed, Correct Patient, Correct Site, Correct Laterality, Correct Procedure, Correct Position, site marked, Risks and benefits discussed, pre-op evaluation,  At surgeon's request and post-op pain management  Laterality: Left  Prep: Maximum Sterile Barrier Precautions used, chloraprep       Needles:  Injection technique: Single-shot  Needle Type: Echogenic Stimulator Needle     Needle Length: 9cm  Needle Gauge: 22     Additional Needles:   Procedures:,,,, ultrasound used (permanent image in chart),,,,  Narrative:  Start time: 04/12/2020 7:11 AM End time: 04/12/2020 7:14 AM Injection made incrementally with aspirations every 5 mL.  Performed by: Personally  Anesthesiologist: Brennan Bailey, MD  Additional Notes: Risks, benefits, and alternative discussed. Patient gave consent for procedure. Patient prepped and draped in sterile fashion. Sedation administered, patient remains easily responsive to voice. Relevant anatomy identified with ultrasound guidance. Local anesthetic given in 5cc increments with no signs or symptoms of intravascular injection. No pain or paraesthesias with injection. Patient monitored throughout procedure with signs of LAST or immediate complications. Tolerated well. Ultrasound image placed in chart.  Tawny Asal, MD

## 2020-04-12 NOTE — Anesthesia Procedure Notes (Signed)
Procedure Name: Intubation Date/Time: 04/12/2020 7:59 AM Performed by: Imagene Riches, CRNA Pre-anesthesia Checklist: Patient identified, Emergency Drugs available, Suction available and Patient being monitored Patient Re-evaluated:Patient Re-evaluated prior to induction Oxygen Delivery Method: Circle System Utilized Preoxygenation: Pre-oxygenation with 100% oxygen Induction Type: IV induction Ventilation: Mask ventilation without difficulty and Oral airway inserted - appropriate to patient size Laryngoscope Size: Sabra Heck and 2 Grade View: Grade II Tube type: Oral Tube size: 7.0 mm Number of attempts: 1 Airway Equipment and Method: Stylet and Oral airway Placement Confirmation: ETT inserted through vocal cords under direct vision,  positive ETCO2 and breath sounds checked- equal and bilateral Secured at: 22 cm Tube secured with: Tape Dental Injury: Teeth and Oropharynx as per pre-operative assessment  Comments: Patient ramped to facilitate intubation. Easy one handed mask with oral airway. Easy, atraumatic intubation with grade 2 view of vocal cords.

## 2020-04-12 NOTE — Transfer of Care (Signed)
Immediate Anesthesia Transfer of Care Note  Patient: Teresa Hayes  Procedure(s) Performed: LEFT BREAST LUMPECTOMY WITH RADIOACTIVE SEED AND SENTINEL LYMPH NODE BIOPSY (Left Breast)  Patient Location: PACU  Anesthesia Type:General  Level of Consciousness: awake and alert   Airway & Oxygen Therapy: Patient Spontanous Breathing and Patient connected to face mask oxygen  Post-op Assessment: Report given to RN and Post -op Vital signs reviewed and stable  Post vital signs: Reviewed and stable  Last Vitals:  Vitals Value Taken Time  BP    Temp    Pulse    Resp    SpO2      Last Pain:  Vitals:   04/12/20 0618  TempSrc: Oral  PainSc: 0-No pain         Complications: No apparent anesthesia complications

## 2020-04-12 NOTE — Anesthesia Postprocedure Evaluation (Signed)
Anesthesia Post Note  Patient: Teresa Hayes  Procedure(s) Performed: LEFT BREAST LUMPECTOMY WITH RADIOACTIVE SEED (Left Breast) Left Sentinel Node Biopsy (Left Axilla)     Patient location during evaluation: PACU Anesthesia Type: General Level of consciousness: awake and alert and oriented Pain management: pain level controlled Vital Signs Assessment: post-procedure vital signs reviewed and stable Respiratory status: spontaneous breathing, nonlabored ventilation and respiratory function stable Cardiovascular status: blood pressure returned to baseline Postop Assessment: no apparent nausea or vomiting Anesthetic complications: no    Last Vitals:  Vitals:   04/12/20 1032 04/12/20 1047  BP: 102/64 110/65  Pulse: 71 76  Resp: 16 16  Temp: (!) 36.2 C   SpO2: 100% 95%    Last Pain:  Vitals:   04/12/20 1047  TempSrc:   PainSc: [place]

## 2020-04-12 NOTE — Op Note (Signed)
Left Breast Radioactive seed localized lumpectomy and sentinel lymph node biopsy  Indications: This patient presents with history of left breast cancer, lower outer quadrant, cT2N0M0, grade 2 invasive mammary carcinoma, lobular phenotype, +/+/-  Pre-operative Diagnosis: left breast cancer  Post-operative Diagnosis: same  Surgeon: [name]   Anesthesia: General endotracheal anesthesia  ASA Class: 3  Procedure Details  The patient was seen in the Holding Room. The risks, benefits, complications, treatment options, and expected outcomes were discussed with the patient. The possibilities of bleeding, infection, the need for additional procedures, failure to diagnose a condition, and creating a complication requiring transfusion or operation were discussed with the patient. The patient concurred with the proposed plan, giving informed consent.  The site of surgery properly noted/marked. The patient was taken to Operating Room # 2, identified, and the procedure verified as Left Breast seed localized Lumpectomy with sentinel lymph node biopsy. A Time Out was held and the above information confirmed.  The left arm, breast, and chest were prepped and draped in standard fashion. The lumpectomy was performed by creating an circumlinear elliptical incision incorporating the overlying skin in the  Lower outer quadrant of the breast over the previously placed radioactive seed.  Dissection was carried down to around the point of maximum signal intensity. The cautery was used to perform the dissection.  Hemostasis was achieved with cautery. The edges of the cavity were marked with large clips, with one each medial, lateral, inferior and superior, and two clips posteriorly.   The specimen was inked with the margin marker paint kit.    Specimen radiography confirmed inclusion of the mammographic lesion, the clip, and the seed.  The background signal in the breast was zero.  The wound was irrigated and closed with  3-0 vicryl in layers and 4-0 monocryl subcuticular suture.    Using a hand-held gamma probe, left axillary sentinel nodes were identified transcutaneously.  An oblique incision was created below the axillary hairline.  Dissection was carried through the clavipectoral fascia.  Two deep level 2 axillary sentinel nodes were removed.  Counts per second were 871 and 213.    The background count was 0 cps.  The wound was irrigated.  Hemostasis was achieved with cautery.  The axillary incision was closed with a 3-0 vicryl deep dermal interrupted sutures and a 4-0 monocryl subcuticular closure.    Sterile dressings were applied. At the end of the operation, all sponge, instrument, and needle counts were correct.  Findings: grossly clear surgical margins and no adenopathy.  Anterior margin is skin with some of the skin taken with the excision  Estimated Blood Loss:  min         Specimens: left breast lumpectomy and two left axillary sentinel lymph nodes.             Complications:  None; patient tolerated the procedure well.         Disposition: PACU - hemodynamically stable.         Condition: stable

## 2020-04-12 NOTE — Interval H&P Note (Signed)
History and Physical Interval Note:  04/12/2020 7:37 AM  Teresa Hayes  has presented today for surgery, with the diagnosis of LEFT BREAST CANCER.  The various methods of treatment have been discussed with the patient and family. After consideration of risks, benefits and other options for treatment, the patient has consented to  Procedure(s) with comments: BREAST LUMPECTOMY WITH RADIOACTIVE SEED AND SENTINEL LYMPH NODE BIOPSY (Left) - COMBINED WITH REGIONAL FOR POST OP PAIN as a surgical intervention.  The patient's history has been reviewed, patient examined, no change in status, stable for surgery.  I have reviewed the patient's chart and labs.  Questions were answered to the patient's satisfaction.     [name]

## 2020-04-16 LAB — SURGICAL PATHOLOGY

## 2020-04-17 ENCOUNTER — Telehealth: Payer: Self-pay | Admitting: *Deleted

## 2020-04-17 ENCOUNTER — Encounter: Payer: Self-pay | Admitting: *Deleted

## 2020-04-17 NOTE — Telephone Encounter (Signed)
Received order for Oncotype Testing. Requisition faxed to pathology and [facility].

## 2020-04-30 ENCOUNTER — Encounter: Payer: Self-pay | Admitting: *Deleted

## 2020-04-30 ENCOUNTER — Encounter: Payer: Self-pay | Admitting: General Practice

## 2020-04-30 NOTE — Progress Notes (Signed)
[facility] Spiritual Care Note  Referred by Dr Jana Hakim for an additional layer of support, but haven't been able to reach Ms Fayette by phone. Left voicemail encouraging callback and will continue trying...   [place], [place], [facility] Pager [phone] Voicemail [phone]

## 2020-05-02 ENCOUNTER — Encounter: Payer: Self-pay | Admitting: General Practice

## 2020-05-02 NOTE — Progress Notes (Signed)
[facility] Spiritual Care Note  Left voicemail encouraging callback.   [place], [place], [facility] Pager [phone] Voicemail [phone]

## 2020-05-10 ENCOUNTER — Encounter: Payer: Self-pay | Admitting: *Deleted

## 2020-05-10 ENCOUNTER — Telehealth: Payer: Self-pay | Admitting: *Deleted

## 2020-05-10 DIAGNOSIS — Z170 Estrogen receptor positive status [ER+]: Secondary | ICD-10-CM

## 2020-05-10 NOTE — Telephone Encounter (Signed)
Received oncotype results of 9/3%.  I have called and left a voicemail for patient to return my call to give results and discuss next step with xrt.

## 2020-05-11 ENCOUNTER — Encounter: Payer: Self-pay | Admitting: Oncology

## 2020-05-15 ENCOUNTER — Encounter: Payer: Self-pay | Admitting: *Deleted

## 2020-05-16 ENCOUNTER — Telehealth: Payer: Self-pay | Admitting: Oncology

## 2020-05-16 NOTE — Telephone Encounter (Signed)
Scheduled per 6/22 sch message. Unable to reach pt. Left voicemail with appt time and date. 

## 2020-05-21 ENCOUNTER — Encounter (HOSPITAL_COMMUNITY): Payer: Self-pay | Admitting: Oncology

## 2020-05-22 ENCOUNTER — Encounter: Payer: Self-pay | Admitting: *Deleted

## 2020-06-04 ENCOUNTER — Encounter: Payer: Self-pay | Admitting: *Deleted

## 2020-06-05 ENCOUNTER — Ambulatory Visit: Payer: 59 | Admitting: Oncology

## 2020-06-12 ENCOUNTER — Other Ambulatory Visit: Payer: Self-pay | Admitting: General Surgery

## 2020-06-12 DIAGNOSIS — Z9889 Other specified postprocedural states: Secondary | ICD-10-CM

## 2020-07-04 ENCOUNTER — Other Ambulatory Visit: Payer: Self-pay | Admitting: General Surgery

## 2020-07-04 DIAGNOSIS — Z9889 Other specified postprocedural states: Secondary | ICD-10-CM

## 2020-07-09 ENCOUNTER — Other Ambulatory Visit: Payer: Self-pay | Admitting: General Surgery

## 2020-07-09 ENCOUNTER — Telehealth: Payer: Self-pay

## 2020-07-09 DIAGNOSIS — N6489 Other specified disorders of breast: Secondary | ICD-10-CM

## 2020-07-09 DIAGNOSIS — Z9889 Other specified postprocedural states: Secondary | ICD-10-CM

## 2020-07-09 NOTE — Telephone Encounter (Signed)
Pt called inquiring about COVID booster; stating she is interested and asks if it is available yet for pt's who are immunocompromised.   This nurse informed pt that CDC has nor yet released to [place] information about booster regulations. I have placed pt on a list of pts who are interested and informed pt we would call her when we have more information. Pt verbalized thanks and understanding.

## 2020-07-12 ENCOUNTER — Other Ambulatory Visit: Payer: 59

## 2020-07-25 ENCOUNTER — Other Ambulatory Visit: Payer: Self-pay

## 2020-07-25 ENCOUNTER — Ambulatory Visit
Admission: RE | Admit: 2020-07-25 | Discharge: 2020-07-25 | Disposition: A | Discharge status: Home / Self Care | Payer: 59 | Source: Ambulatory Visit | Attending: General Surgery | Admitting: General Surgery

## 2020-07-25 ENCOUNTER — Other Ambulatory Visit: Payer: Self-pay | Admitting: General Surgery

## 2020-07-25 DIAGNOSIS — Z9889 Other specified postprocedural states: Secondary | ICD-10-CM

## 2020-07-25 DIAGNOSIS — N6489 Other specified disorders of breast: Secondary | ICD-10-CM

## 2020-07-25 NOTE — Progress Notes (Signed)
[facility]  [phone] [phone]     ID: Teresa Hayes DOB: 12-Mar-1966  MR#: [id number]  TKZ#:[id number]  Patient Care Team: Teresa Greenhouse, MD as PCP - General (Unknown Physician Specialty) Teresa Salines, DO as PCP - Cardiology (Cardiology) Teresa Kaufmann, RN as Oncology Nurse Navigator Teresa Germany, RN as Oncology Nurse Navigator Teresa Hayes, Teresa Dad, MD as Consulting Physician (Oncology) Teresa Klein, MD as Consulting Physician (General Surgery) Teresa Rudd, MD as Consulting Physician (Radiation Oncology) Teresa Lose, MD as Consulting Physician (Obstetrics and Gynecology) Teresa Cruel, MD OTHER MD:  CHIEF COMPLAINT: estrogen receptor positive breast cancer  CURRENT TREATMENT: exemestane   INTERVAL HISTORY: Teresa Hayes returns today for follow up of her estrogen receptor positive breast cancer.   We prescribed exemestane for her at her last visit on 01/30/2020.  However she never filled it.  She says she does not want to endanger her heart.  Since her last visit, she underwent preoperative cardiac CT on 02/24/2020 showing a coronary calcium score of 0.  This showed incidentally a 3 mm nodule in the left upper lobe.  It was stated that "no follow-up is needed if patient is low risk".  Otherwise a noncontrast chest CT could be considered in 12 months.  She proceeded to left lumpectomy on 04/12/2020 under Dr. Barry Hayes. Pathology from the procedure ([id number]) revealed: invasive lobular carcinoma, grade 2, 1.3 cm; lobular neoplasia; negative resection margins.  Both of the biopsied lymph nodes were negative for carcinoma (0/2).  Oncotype DX was obtained on the final surgical sample and the recurrence score of 9 predicts a risk of recurrence outside the breast over the next 9 years of 3%, if the patient's only systemic therapy is an antiestrogen for 5 years.  It also predicts no benefit from chemotherapy.  At recent follow up with  Dr. Barry Hayes, Teresa Hayes complained of left axilla swelling and tenderness. She underwent left axilla ultrasound yesterday, 07/25/2020, showing no abnormalities.  The patient was referred for radiation treatments but she decided against it.  She says she did not want to and it was too expensive.   REVIEW OF SYSTEMS: Teresa Hayes is having some discomfort in the left axillary area.  She had her ultrasound yesterday which was unremarkable.  She is back to work.  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  She is not otherwise exercising.  She does not belong to a gym.  She did receive the [place] vaccine x2, second dose in March.  A detailed review of systems today was otherwise noncontributory   HISTORY OF CURRENT ILLNESS: From the original intake note:  Teresa Hayes presented to her OBGYN with a tender, palpable left breast abnormality for several months. She was set up for bilateral diagnostic mammography with tomography and left breast ultrasonography at [facility] on 08/17/2019 showing: breast density category B; 2.1 cm left breast mas at 3:30; left axilla negative for adenopathy.  Accordingly on 08/22/2019 she proceeded to biopsy of the left breast area in question. The pathology from this procedure ([id number]) showed: invasive lobular carcinoma, grade 1-2, e-cadherin negative. Prognostic indicators significant for: estrogen receptor, 100% positive and progesterone receptor, 100% positive, both with strong staining intensity. Proliferation marker Ki67 at 10%. HER2 negative by immunohistochemistry (1+).  The patient's subsequent history is as detailed below.   PAST MEDICAL HISTORY: Past Medical History:  Diagnosis Date  . Arthritis    knees  . Family history of pancreatic cancer   .  Hypertension   . Lymph edema    idiopathic  . Malignant neoplasm of lower-outer quadrant of left breast of female, estrogen receptor positive ([place]) 08/24/2019  . Peripheral vascular disease  (HCC)    lymphedema - right leg - wears comp hose  . PONV (postoperative nausea and vomiting)   . SVD (spontaneous vaginal delivery) x 2    PAST SURGICAL HISTORY: Past Surgical History:  Procedure Laterality Date  . BREAST LUMPECTOMY WITH RADIOACTIVE SEED AND SENTINEL LYMPH NODE BIOPSY Left 04/12/2020   Procedure: LEFT BREAST LUMPECTOMY WITH RADIOACTIVE SEED;  Surgeon: Teresa Klein, MD;  Location: [facility];  Service: General;  Laterality: Left;  . CHOLECYSTECTOMY    . HYSTEROSCOPY WITH D & C  10/14/2012   Procedure: DILATATION AND CURETTAGE /HYSTEROSCOPY;  Surgeon: Teresa Lose, MD;  Location: [facility];  Service: Gynecology;  Laterality: N/A;  wants ultrasound guidance  ask scheduler to remember to tell surgeon to put order in  . KNEE SURGERY Right   . SENTINEL NODE BIOPSY Left 04/12/2020   Procedure: Left Sentinel Node Biopsy;  Surgeon: Teresa Klein, MD;  Location[facility];  Service: General;  Laterality: Left;    FAMILY HISTORY: Family History  Problem Relation Age of Onset  . Pancreatic cancer Maternal Grandmother        dx 43s  . Cancer Maternal Grandfather        unk type, possibly GI   Patient's father was 53 years old when he died from a ruptured abdominal aortic aneurysm. Patient's mother is age 55 (as of 08/2019). The patient denies a family hx of breast or ovarian cancer. She does note pancreatic cancer in her maternal grandmother and some form of GI cancer in her paternal grandmother. She has 2 siblings, 1 brother and 1 sister.   GYNECOLOGIC HISTORY:  Patient's last menstrual period was 06/02/2015 (exact date). Menarche: 54 years old Age at first live birth: 54 years old [place] P 2 Contraceptive: pills from 1982 - 1985 and injections from 1996 - 2004, 2005 - 2009. HRT: progesterone in 2014, 2018 - 2019,  Hysterectomy? no BSO? 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 She is single. She lives by herself,  no pets. Daughter Teresa Hayes, age 59, works as an Web designer in an Merchandiser, retail in [place], [place]. Son Teresa Hayes, age 50, works in Scientist, research (medical) in [place], [place].  The patient has no grandchildren. She does not attend a church.    ADVANCED DIRECTIVES: not in place. She intends to name her daughter, Teresa Hayes, as her 22. (Teresa Hayes is currently listed as the patient's emergency contact).  The appropriate documents for her to complete a notarized were provided on the 09/07/2019 visit   HEALTH MAINTENANCE: Social History   Tobacco Use  . Smoking status: Never Smoker  . Smokeless tobacco: Never Used  Vaping Use  . Vaping Use: Never used  Substance Use Topics  . Alcohol use: Yes    Alcohol/week: 0.0 standard drinks    Comment: rarely  . Drug use: No     Colonoscopy: none on file  PAP: 06/2016, normal  Bone density: none on file   Allergies  Allergen Reactions  . Ultram [Tramadol] Itching  . Topiramate Other (See Comments)    Intolerant   . Shellfish Allergy Rash    Current Outpatient Medications  Medication Sig Dispense Refill  . exemestane (AROMASIN) 25 MG tablet Take 1 tablet (25 mg total) by  mouth daily after breakfast. 90 tablet 4  . lisinopril (ZESTRIL) 20 MG tablet Take 1 tablet (20 mg total) by mouth daily. Take with Zestoretic for a total of 57m /12.549m90 tablet 3  . lisinopril (ZESTRIL) 20 MG tablet Take 20 mg by mouth daily.    . [place] Kitchenisinopril-hydrochlorothiazide (ZESTORETIC) 20-12.5 MG tablet Take 1 tablet by mouth daily. Take with lisinopril 20 for total of 405m2.5 mg     No current facility-administered medications for this visit.    OBJECTIVE: White woman in no acute distress  Vitals:   07/26/20 0916  BP: 124/76  Pulse: 80  Resp: 20  Temp: 98.5 F (36.9 C)  SpO2: 98%     Body mass index is 58.43 kg/m.   Wt Readings from Last 3 Encounters:  07/26/20 (!) 362 lb (164.2 kg)  04/12/20 (!) 368 lb 1.6 oz (167 kg)  04/09/20 (!) 368 lb 1.6 oz (167 kg)     Sclerae unicteric, EOMs intact Wearing a mask No cervical or supraclavicular adenopathy Lungs no rales or rhonchi Heart regular rate and rhythm Abd soft, nontender, positive bowel sounds MSK no focal spinal tenderness, no upper extremity lymphedema Neuro: nonfocal, well oriented, appropriate affect Breasts: In the upper outer quadrant of the right breast there is a palpable mass measuring approximately half a centimeter which is firm and movable.  There is no overlying erythema or other skin or nipple changes.  The left breast is status post lumpectomy.  There is some discomfort with exam in the left axilla but no evidence of residual or recurrent disease.   LAB RESULTS:  CMP     Component Value Date/Time   NA 138 04/09/2020 1249   NA 137 09/12/2019 1529   K 4.3 04/09/2020 1249   CL 102 04/09/2020 1249   CO2 27 04/09/2020 1249   GLUCOSE 96 04/09/2020 1249   BUN 14 04/09/2020 1249   BUN 13 09/12/2019 1529   CREATININE 0.96 04/09/2020 1249   CREATININE 0.89 09/07/2019 1220   CALCIUM 9.1 04/09/2020 1249   PROT 8.2 (H) 09/07/2019 1220   ALBUMIN 3.5 09/07/2019 1220   AST 15 09/07/2019 1220   ALT 14 09/07/2019 1220   ALKPHOS 87 09/07/2019 1220   BILITOT 0.3 09/07/2019 1220   GFRNONAA >60 04/09/2020 1249   GFRNONAA >60 09/07/2019 1220   GFRAA >60 04/09/2020 1249   GFRAA >60 09/07/2019 1220   No results found for: TOTALPROTELP, ALBUMINELP, A1GS, A2GS, BETS, BETA2SER, GAMS, MSPIKE, SPEI  No results found for: KPAFRELGTCHN, LAMBDASER, KAPLAMBRATIO  Lab Results  Component Value Date   WBC 9.2 04/09/2020   NEUTROABS 5.4 09/07/2019   HGB 12.4 04/09/2020   HCT 39.9 04/09/2020   MCV 100.8 (H) 04/09/2020   PLT 328 04/09/2020   No results found for: LABCA2  No components found for: LABPPIRJJ884o results for input(s): INR in the last 168 hours.  No results found for: LABCA2  No results found for: CANZYS063o results found for: CANKZS010o results found for: CANXNA355o  results found for: CA2729  No components found for: HGQUANT  No results found for: CEA1 / No results found for: CEA1   No results found for: AFPTUMOR  No results found for: CHROMOGRNA  No results found for: HGBA, HGBA2QUANT, HGBFQUANT, HGBSQUAN (Hemoglobinopathy evaluation)   No results found for: LDH  No results found for: IRON, TIBC, IRONPCTSAT (Iron and TIBC)  No results found for: FERRITIN  Urinalysis    Component Value  Date/Time   COLORURINE AMBER (A) 06/02/2015 1125   APPEARANCEUR CLOUDY (A) 06/02/2015 1125   LABSPEC 1.025 06/02/2015 1125   PHURINE 6.0 06/02/2015 1125   GLUCOSEU NEGATIVE 06/02/2015 1125   HGBUR LARGE (A) 06/02/2015 1125   BILIRUBINUR SMALL (A) 06/02/2015 1125   KETONESUR NEGATIVE 06/02/2015 1125   PROTEINUR 30 (A) 06/02/2015 1125   UROBILINOGEN 0.2 06/02/2015 1125   NITRITE NEGATIVE 06/02/2015 1125   LEUKOCYTESUR TRACE (A) 06/02/2015 1125    STUDIES: [place] AXILLA LEFT  Result Date: 07/25/2020 CLINICAL DATA:  54 year old female status post left breast lumpectomy and sentinel lymph node biopsy in May 2021. The patient complains of swelling and tenderness in the left axilla. She declined mammography today. EXAM: ULTRASOUND OF THE LEFT AXILLA COMPARISON:  Previous exam(s). FINDINGS: On physical exam, postsurgical scar is noted in the low left axilla. There is no overlying skin redness, swelling or warmth. Ultrasound is performed, showing normal soft tissue without focal or suspicious sonographic abnormality. Visualized lymph nodes are morphologically normal. No fluid collections or masses are identified. IMPRESSION: Unremarkable ultrasound evaluation of the left axilla. RECOMMENDATION: 1. Continued clinical and symptomatic follow-up for the patient's left axillary symptoms. 2. Annual bilateral mammography in 1 month. I have discussed the findings and recommendations with the patient. If applicable, a reminder letter will be sent to the patient regarding the next  appointment. BI-RADS CATEGORY  1: Negative. Electronically Signed   By: Kristopher Oppenheim M.D.   On: 07/25/2020 11:30    ELIGIBLE FOR AVAILABLE RESEARCH PROTOCOL:  AET  ASSESSMENT: 54 y.o. [place] woman status post left breast lower outer quadrant biopsy 08/22/2019 for a clinical T2 N0, stage IB invasive lobular carcinoma, estrogen and progesterone receptor strongly positive, HER-2 not amplified, with an MIB-1 of 10%.  (a) breast MRI 12/05/2019 (limited) showed a second focus of non-mass-like enhancement in the upper outer quadrant, unremarkable right breast, no abnormal lymph nodes  (b) biopsy left breast upper outer quadrant 01/03/2020 benign, concordant  (1) genetics testing 09/14/2019 through the [facility] STAT Breast Cancer Panel + Common Hereditary Cancers Panel found no deleterious mutations in ATM, BRCA1, BRCA2, CDH1, CHEK2, PALB2, PTEN, STK11 and TP53.  The Common Hereditary Cancers Panel offered by [facility] includes sequencing and/or deletion duplication testing of the following 48 genes: APC, ATM, AXIN2, BARD1, BMPR1A, BRCA1, BRCA2, BRIP1, CDH1, CDKN2A (p14ARF), CDKN2A (p16INK4a), CKD4, CHEK2, CTNNA1, DICER1, EPCAM (Deletion/duplication testing only), GREM1 (promoter region deletion/duplication testing only), KIT, MEN1, MLH1, MSH2, MSH3, MSH6, MUTYH, NBN, NF1, NHTL1, PALB2, PDGFRA, PMS2, POLD1, POLE, PTEN, RAD50, RAD51C, RAD51D, RNF43, SDHB, SDHC, SDHD, SMAD4, SMARCA4. STK11, TP53, TSC1, TSC2, and VHL.  The following genes were evaluated for sequence changes only: SDHA and HOXB13 c.251G>A variant only.    (2) status post left lumpectomy and sentinel lymph node sampling 04/12/2020 for a pT1c pN0, stage 1A invasive lobular carcinoma, grade 2, with negative margins  (a) a total of 2 left axillary lymph nodes were removed  (3) Oncotype score of 9 predicts a risk of recurrence outside the breast in the next 9 years of 3% if the patient's only systemic therapy is antiestrogens for 5 years.  It also  predicts no benefit from adjuvant chemotherapy.  (4) adjuvant radiation cancelled by patient  (5) anastrozole started 09/08/2019 in anticipation of surgical delays; discontinued by patient 01/09/2020 because of symptoms of increased heart rate, chest pain, edema and other symptoms as noted above  (6) exemestane ordered 01/30/2020, never started by patient   PLAN: Gulianna did well with  her surgery. She has some postoperative discomfort in the left axilla which she is managing and which she understands is not related to breast cancer recurrence.  She decided against radiation partly for financial but also for other reasons. She understands this significantly increases her risk of local recurrence. While local recurrence probably will not affect her survival, assuming we keep a close eye on things, it certainly can end up in a more aggressive tumor that might require chemotherapy or more aggressive treatment, perhaps a mastectomy. She is aware of this.  I suggested she try exemestane. She understands that it would lower the risk of local recurrence as well as the risk of systemic recurrence. We had placed the bruit prescription earlier. She tells me that she is terrified that taking antiestrogens will ruin her heart. She just cannot take that risk she says.  She understands that if she wanted to obtain the excellent results predicted by Oncotype she would have to take antiestrogens for 5 years. At this point however she is adamant against it  She does have a palpable change in the right upper quadrant of the breast. I am setting her up for mammography and ultrasonography. Hopefully this will be benign, perhaps fat necrosis or similar.  I suggested she join a gym and try water aerobics which would be good to good for her lower extremity lymphedema as well as for general conditioning  We are going to do her yearly mammography in December and she will see me shortly thereafter  She knows to call for  any other issue that may develop before then  Total encounter time 30 minutes.Teresa Cruel, MD   07/26/2020 9:29 AM Medical Oncology and Hematology [facility] [place], [place] [address] Tel. [phone]    Fax. [phone]   This document serves as a record of services personally performed by Lurline Del, MD. It was created on his behalf by Wilburn Mylar, a trained medical scribe. The creation of this record is based on the scribe's personal observations and the provider's statements to them.   I, Lurline Del MD, have reviewed the above documentation for accuracy and completeness, and I agree with the above.   *Total Encounter Time as defined by the Centers for Medicare and Medicaid Services includes, in addition to the face-to-face time of a patient visit (documented in the note above) non-face-to-face time: obtaining and reviewing outside history, ordering and reviewing medications, tests or procedures, care coordination (communications with other health care professionals or caregivers) and documentation in the medical record.

## 2020-07-26 ENCOUNTER — Inpatient Hospital Stay: Payer: 59 | Attending: Oncology | Admitting: Oncology

## 2020-07-26 ENCOUNTER — Encounter: Payer: Self-pay | Admitting: *Deleted

## 2020-07-26 ENCOUNTER — Telehealth: Payer: Self-pay | Admitting: Oncology

## 2020-07-26 ENCOUNTER — Other Ambulatory Visit: Payer: Self-pay

## 2020-07-26 VITALS — BP 124/76 | HR 80 | Temp 98.5°F | Resp 20 | Ht 66.0 in | Wt 362.0 lb

## 2020-07-26 DIAGNOSIS — C50512 Malignant neoplasm of lower-outer quadrant of left female breast: Secondary | ICD-10-CM | POA: Diagnosis not present

## 2020-07-26 DIAGNOSIS — Z79811 Long term (current) use of aromatase inhibitors: Secondary | ICD-10-CM | POA: Diagnosis not present

## 2020-07-26 DIAGNOSIS — Z800 Family history of malignant neoplasm of digestive organs: Secondary | ICD-10-CM | POA: Insufficient documentation

## 2020-07-26 DIAGNOSIS — Z170 Estrogen receptor positive status [ER+]: Secondary | ICD-10-CM

## 2020-07-26 DIAGNOSIS — Z79899 Other long term (current) drug therapy: Secondary | ICD-10-CM | POA: Insufficient documentation

## 2020-07-26 DIAGNOSIS — I10 Essential (primary) hypertension: Secondary | ICD-10-CM | POA: Insufficient documentation

## 2020-07-26 NOTE — Telephone Encounter (Signed)
Scheduled appts per 9/2 los. Gave pt a print out of AVS.

## 2020-08-14 ENCOUNTER — Telehealth: Payer: Self-pay | Admitting: *Deleted

## 2020-08-14 NOTE — Telephone Encounter (Signed)
Received call from the breast center stating they have called and spoke with the patient to get her set up for right mammo/us but she is also due for her diag bil mammo, but they state patient does not want to have her left side diag mammo at this time. She only wants the right breast evaluated because she is still healing from sx. Her sx was in May of this year.  Per radiology protocol if a patient is due or overdue for mammo they have to do a bilateral. Informed the breast center I would give the patient a call regarding this.  Spoke with patient and she is adamant about not having the left side mammo and that Dr. Jana Hakim had told her she could wait till December. She still wants the right breast evaluated for this palpable area.  Informed her I would talk with the breast center and see what can be done and get back with her.  She verbalized understanding.

## 2020-08-15 ENCOUNTER — Telehealth: Payer: Self-pay | Admitting: *Deleted

## 2020-08-15 NOTE — Telephone Encounter (Signed)
Called and spoke with patient to let her know that per Dr. Enriqueta Shutter at the breast center they have to follow protocol and that she would need to have her bilateral diag mammo but they would still do the right u/s as well. She did agree to go ahead so I let her know that they will be calling her to schedule.  Patient verbalized understanding.

## 2020-08-29 ENCOUNTER — Other Ambulatory Visit: Payer: Self-pay | Admitting: Cardiology

## 2020-09-13 ENCOUNTER — Ambulatory Visit
Admission: RE | Admit: 2020-09-13 | Discharge: 2020-09-13 | Disposition: A | Discharge status: Home / Self Care | Payer: 59 | Source: Ambulatory Visit | Attending: Oncology | Admitting: Oncology

## 2020-09-13 ENCOUNTER — Other Ambulatory Visit: Payer: Self-pay

## 2020-09-13 DIAGNOSIS — C50512 Malignant neoplasm of lower-outer quadrant of left female breast: Secondary | ICD-10-CM

## 2020-09-25 ENCOUNTER — Encounter: Payer: Self-pay | Admitting: Licensed Clinical Social Worker

## 2020-09-25 NOTE — Progress Notes (Signed)
Kiel Work  CSW received TC from patient inquiring about financial assistance for medical bills from surgery. Patient is not currently in active treatment. CSW informed patient of limited assistance offered by certain foundations such as [employer] and [employer]. Pt requested information be e-mailed to her. CSW sent information.  No other needs at this time.   Edwinna Areola Clarissa Laird, LCSW

## 2020-10-30 ENCOUNTER — Ambulatory Visit
Admission: RE | Admit: 2020-10-30 | Discharge: 2020-10-30 | Disposition: A | Discharge status: Home / Self Care | Payer: 59 | Source: Ambulatory Visit | Attending: Oncology | Admitting: Oncology

## 2020-10-30 ENCOUNTER — Other Ambulatory Visit: Payer: Self-pay

## 2020-10-30 DIAGNOSIS — Z170 Estrogen receptor positive status [ER+]: Secondary | ICD-10-CM

## 2020-10-30 DIAGNOSIS — C50512 Malignant neoplasm of lower-outer quadrant of left female breast: Secondary | ICD-10-CM

## 2020-11-01 ENCOUNTER — Telehealth: Payer: Self-pay | Admitting: Oncology

## 2020-11-01 NOTE — Telephone Encounter (Signed)
Cancelled 12/13 appts per 12/9 incoming call from pt requesting to cancel appts. Pt stated she would call back to reschedule.

## 2020-11-05 ENCOUNTER — Ambulatory Visit: Payer: 59 | Admitting: Oncology

## 2020-11-05 ENCOUNTER — Other Ambulatory Visit: Payer: 59

## 2021-04-25 ENCOUNTER — Other Ambulatory Visit: Payer: Self-pay | Admitting: Hematology & Oncology

## 2021-04-25 ENCOUNTER — Other Ambulatory Visit: Payer: Self-pay | Admitting: Nurse Practitioner

## 2021-04-25 DIAGNOSIS — N644 Mastodynia: Secondary | ICD-10-CM

## 2021-05-17 IMAGING — MG MM DIGITAL DIAGNOSTIC UNILAT*R* W/ TOMO W/ CAD
6 series · 6 of 18 positions shown · non-contrast
Comparison: Previous exam(s).

ACR Breast Density Category a: The breast tissue is almost entirely
fatty.

CLINICAL DATA: 54-year-old female presenting for evaluation of a
palpable mass in the upper-outer quadrant of the right breast
identified clinical breast exam. The patient has history of left
breast cancer status post lumpectomy

EXAM:
DIGITAL DIAGNOSTIC UNILATERAL RIGHT MAMMOGRAM WITH TOMO AND CAD;
ULTRASOUND RIGHT BREAST LIMITED

[R TAN synth-2D]
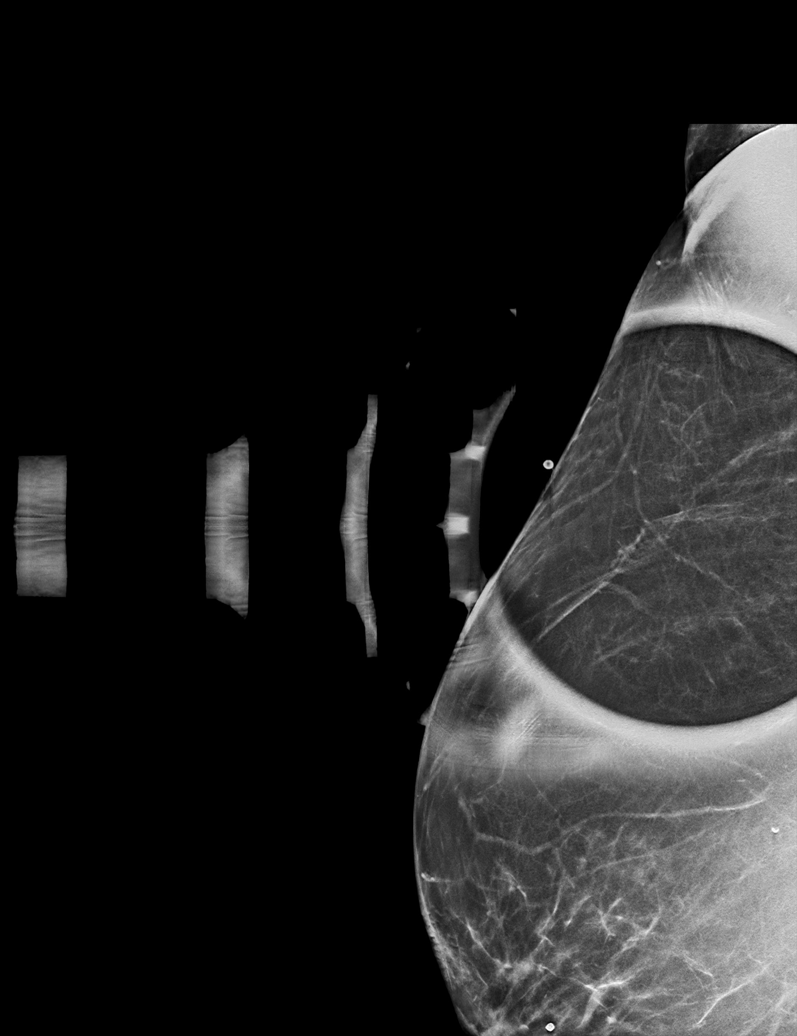

[R CC synth-2D]
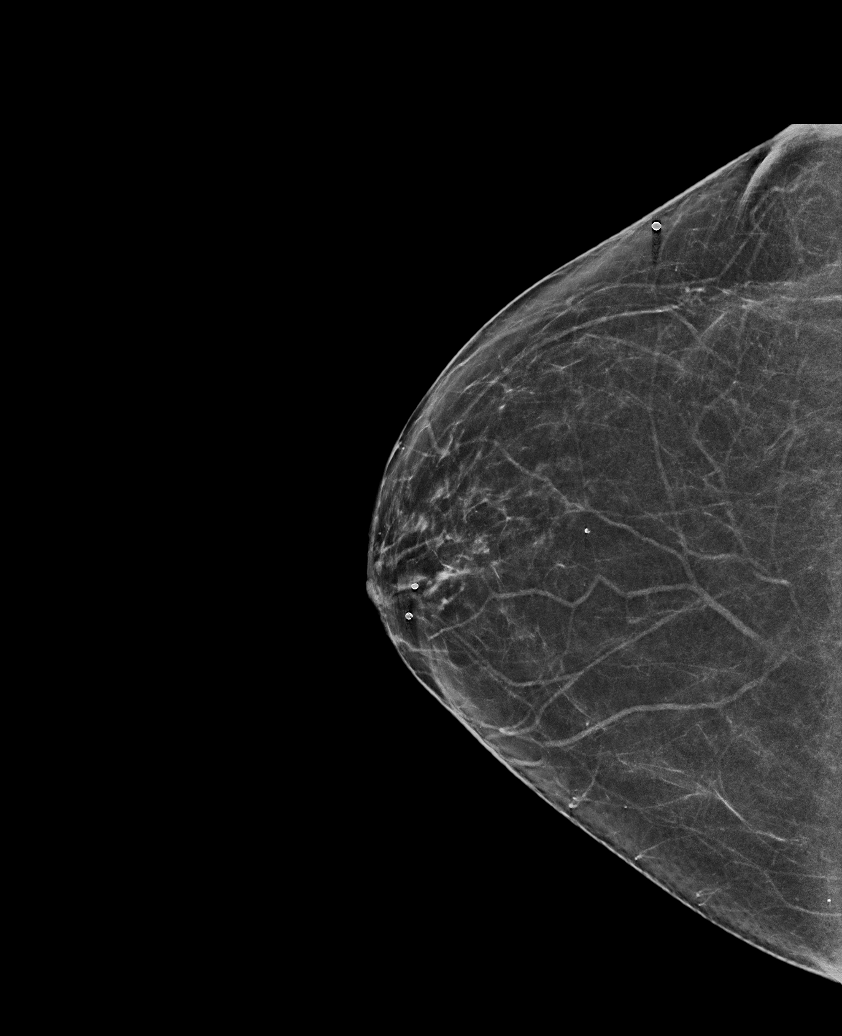

[R MLO synth-2D]
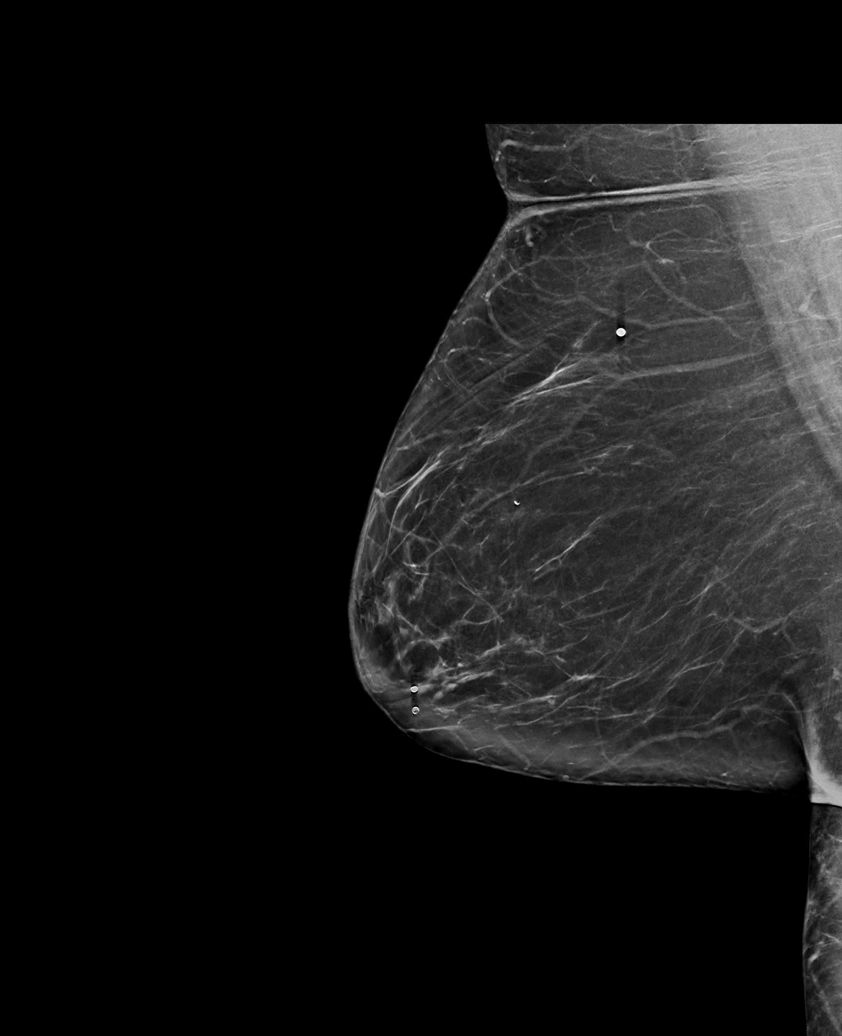

[R TAN tomo · tomo slice 27/53.0]
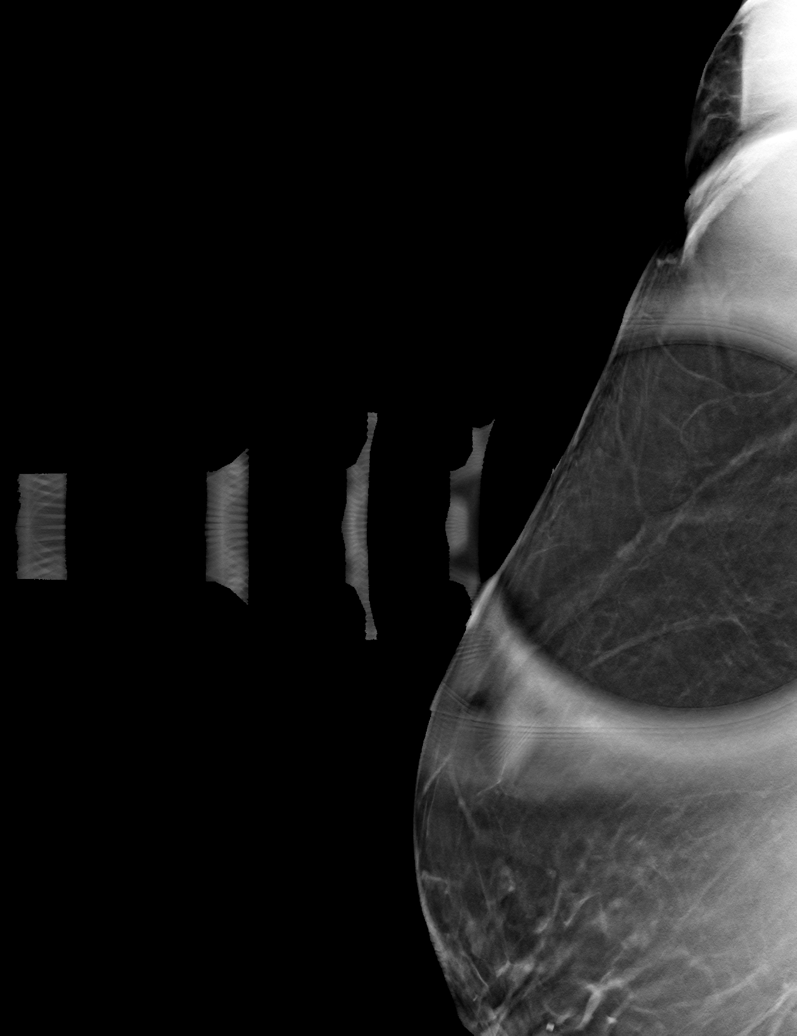

[R CC tomo · tomo slice 32/63.0]
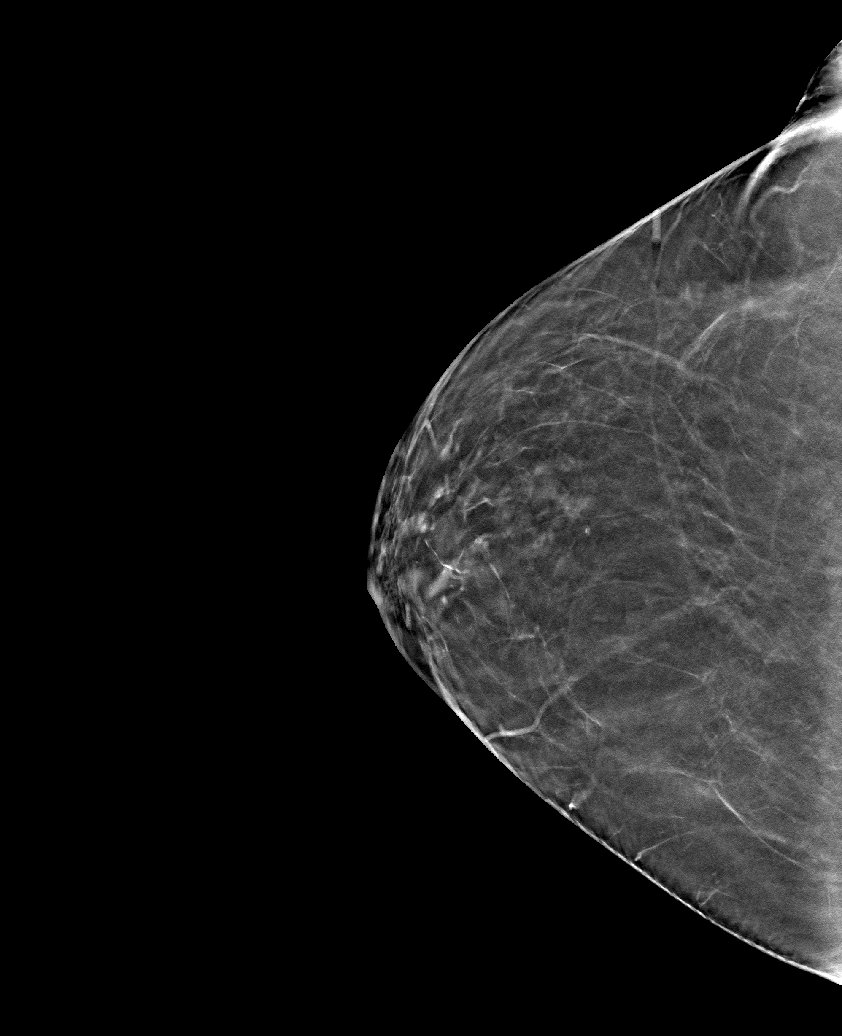

[R MLO tomo · tomo slice 39/77.0]
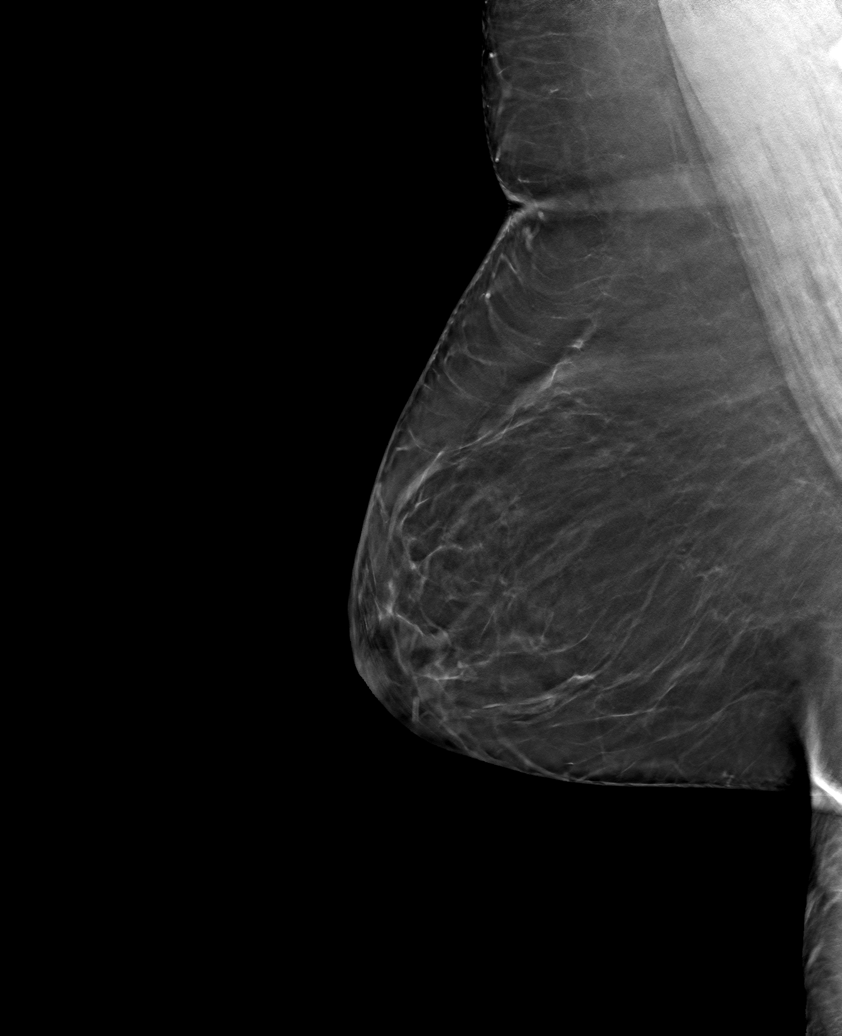

[6 of 18 positions shown; findings below may reference images not displayed]

FINDINGS: No suspicious calcifications, masses or areas of distortion are seen
in the right breast. No abnormal mammographic findings are seen deep
to the palpable marker which has been placed along the lateral
aspect of the right breast.

Mammographic images were processed with CAD.

Physical exam of the upper-outer quadrant of the right breast
demonstrates no discrete palpable masses.

Ultrasound targeted to the upper-outer quadrant of the right breast
demonstrates normal fibroglandular tissue. No suspicious masses or
areas of shadowing are identified.
IMPRESSION: 1. There are no mammographic or targeted sonographic abnormalities
are identified at the palpable site of concern.

RECOMMENDATION:
The patient is overdue for her left breast mammogram, but declined
to have it performed today stating that Dr. Glaser has scheduled
for this exam for [place]. Ideally, she would have the bilateral
diagnostic mammogram done as soon as possible.

I have discussed the findings and recommendations with the patient.
If applicable, a reminder letter will be sent to the patient
regarding the next appointment.

BI-RADS CATEGORY  1: Negative.

## 2021-06-01 ENCOUNTER — Encounter (HOSPITAL_COMMUNITY): Payer: Self-pay

## 2021-06-10 ENCOUNTER — Other Ambulatory Visit: Payer: 59

## 2022-01-12 NOTE — H&P (Incomplete)
Teresa Hayes is an 56 y.o. 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   Work-up: ***  Patient Active Problem List   Diagnosis Date Noted   Chest pain of uncertain etiology 50/53/9767   Essential hypertension 11/23/2019   Morbid obesity ([place]) 11/23/2019   Genetic testing 09/15/2019   Family history of pancreatic cancer    Malignant neoplasm of lower-outer quadrant of left breast of female, estrogen receptor positive ([place]) 08/24/2019   Tendonitis, Achilles, left 02/07/2015   Equinus deformity of foot, acquired 02/07/2015   Menometrorrhagia 10/14/2012    MEDICAL/FAMILY/SOCIAL HX: Patient's last menstrual period was 06/02/2015 (exact date).    Past Medical History:  Diagnosis Date   Arthritis    knees   Family history of pancreatic cancer    Hypertension    Lymph edema    idiopathic   Malignant neoplasm of lower-outer quadrant of left breast of female, estrogen receptor positive ([place]) 08/24/2019   Peripheral vascular disease ([place])    lymphedema - right leg - wears comp hose   PONV (postoperative nausea and vomiting)    SVD (spontaneous vaginal delivery) x 2    Past Surgical History:  Procedure Laterality Date   BREAST BIOPSY Left 07/2019   BREAST LUMPECTOMY Left 03/2020   BREAST LUMPECTOMY WITH RADIOACTIVE SEED AND SENTINEL LYMPH NODE BIOPSY Left 04/12/2020   Procedure: LEFT BREAST LUMPECTOMY WITH RADIOACTIVE SEED;  Surgeon: [name], MD;  Location: [facility];  Service: General;  Laterality: Left;   CHOLECYSTECTOMY     HYSTEROSCOPY WITH D & C  10/14/2012   Procedure: DILATATION AND CURETTAGE /HYSTEROSCOPY;  Surgeon: Thurnell Lose, MD;  Location: [facility];  Service: Gynecology;  Laterality: N/A;  wants ultrasound guidance  ask scheduler to remember to tell surgeon to put order in   [place] Left 04/12/2020   Procedure: Left Sentinel Node Biopsy;  Surgeon: [name], MD;  Location: [facility];  Service: General;  Laterality: Left;    Family History  Problem Relation Age of Onset   Pancreatic cancer Maternal Grandmother        dx 64s   Cancer Maternal Grandfather        unk type, possibly GI    Social History:  reports that she has never smoked. She has never used smokeless tobacco. She reports current alcohol use. She reports that she does not use drugs.  ALLERGIES/MEDS:  Allergies:  Allergies  Allergen Reactions   Ultram [Tramadol] Itching   Topiramate Other (See Comments)    Intolerant    Shellfish Allergy Rash    No medications prior to admission.     Review of Systems  Constitutional: Negative.   HENT: Negative.    Eyes: Negative.   Respiratory: Negative.    Cardiovascular: Negative.   Gastrointestinal: Negative.   Genitourinary: Negative.   Musculoskeletal: Negative.   Skin: Negative.   Neurological: Negative.   Endo/Heme/Allergies: Negative.   Psychiatric/Behavioral: Negative.     Last menstrual period 06/02/2015. Gen:  NAD, pleasant and cooperative Cardio:  RRR Pulm:  CTAB, no wheezes/rales/rhonchi Abd:  Soft, non-distended, non-tender throughout, no rebound/guarding Ext:  No bilateral LE edema, no bilateral calf tenderness  No results found for this or any previous visit (from the past 24 hour(s)).  No results found.   ASSESSMENT/PLAN: Teresa Hayes is a  56 y.o. 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  - Admit to [facility] labs (CBC, T&S) - Diet:  Per anesthesia - IVF:  Per anesthesia - VTE Prophylaxis:  SCDs - Antibiotics: None - D/C home same day  Consents: I discussed with the patient that  this surgery is performed to look inside the uterus and remove the uterine lining.  Prior to surgery, the risks and benefits of the surgery, as well as alternative treatments, have been discussed.  The risks include, but are not limited to bleeding, including the need for a blood transfusion, infection, damage to organs and tissues, including uterine perforation, requiring additional surgery, postoperative pain, short-term and long-term, failure of the procedure to control symptoms, need for hysterectomy to control bleeding, fluid overload, which could create electrolyte abnormalities and the need to stop the procedure before completion, inability to safely complete the procedure, deep vein thrombosis and/or pulmonary embolism, painful intercourse, complications the course of which cannot be predicted or prevented, and death.  Patient was consented for blood products.  The patient is aware that bleeding may result in the need for a blood transfusion which includes risk of transmission of HIV (1:2 million), Hepatitis C (1:2 million), and Hepatitis B (1:200 thousand) and transfusion reaction.  Patient voiced understanding of the above risks as well as understanding of indications for blood transfusion.    Drema Dallas, DO [phone] (office)

## 2022-01-31 ENCOUNTER — Ambulatory Visit (HOSPITAL_COMMUNITY)
Admission: RE | Admit: 2022-01-31 | Payer: Medicaid Other | Source: Home / Self Care | Admitting: Obstetrics and Gynecology

## 2022-01-31 ENCOUNTER — Encounter (HOSPITAL_COMMUNITY): Admission: RE | Payer: Self-pay | Source: Home / Self Care

## 2022-01-31 DIAGNOSIS — N950 Postmenopausal bleeding: Secondary | ICD-10-CM

## 2022-01-31 SURGERY — DILATATION & CURETTAGE/HYSTEROSCOPY WITH MYOSURE
Anesthesia: Choice

## 2023-05-14 ENCOUNTER — Other Ambulatory Visit: Payer: Self-pay

## 2023-05-14 ENCOUNTER — Encounter: Payer: Self-pay | Admitting: Nurse Practitioner

## 2023-05-14 DIAGNOSIS — R9389 Abnormal findings on diagnostic imaging of other specified body structures: Secondary | ICD-10-CM

## 2023-05-14 DIAGNOSIS — N950 Postmenopausal bleeding: Secondary | ICD-10-CM

## 2023-05-15 ENCOUNTER — Other Ambulatory Visit: Payer: Self-pay | Admitting: Nurse Practitioner

## 2023-05-15 ENCOUNTER — Encounter: Payer: Self-pay | Admitting: Nurse Practitioner

## 2023-05-15 DIAGNOSIS — N644 Mastodynia: Secondary | ICD-10-CM

## 2023-05-18 ENCOUNTER — Other Ambulatory Visit: Payer: Self-pay | Admitting: Nurse Practitioner

## 2023-05-18 DIAGNOSIS — N950 Postmenopausal bleeding: Secondary | ICD-10-CM

## 2023-05-18 DIAGNOSIS — R9389 Abnormal findings on diagnostic imaging of other specified body structures: Secondary | ICD-10-CM

## 2023-05-26 ENCOUNTER — Other Ambulatory Visit: Payer: Medicaid Other

## 2024-01-26 ENCOUNTER — Emergency Department (HOSPITAL_BASED_OUTPATIENT_CLINIC_OR_DEPARTMENT_OTHER)
Admission: EM | Admit: 2024-01-26 | Discharge: 2024-01-26 | Disposition: A | Discharge status: Home / Self Care | Attending: Emergency Medicine | Admitting: Emergency Medicine

## 2024-01-26 ENCOUNTER — Emergency Department (HOSPITAL_BASED_OUTPATIENT_CLINIC_OR_DEPARTMENT_OTHER)

## 2024-01-26 ENCOUNTER — Other Ambulatory Visit: Payer: Self-pay

## 2024-01-26 ENCOUNTER — Encounter (HOSPITAL_BASED_OUTPATIENT_CLINIC_OR_DEPARTMENT_OTHER): Payer: Self-pay | Admitting: Emergency Medicine

## 2024-01-26 DIAGNOSIS — I890 Lymphedema, not elsewhere classified: Secondary | ICD-10-CM | POA: Diagnosis not present

## 2024-01-26 DIAGNOSIS — M7989 Other specified soft tissue disorders: Secondary | ICD-10-CM | POA: Diagnosis present

## 2024-01-26 MED ORDER — OXYCODONE HCL 5 MG PO TABS: 5.0000 mg | ORAL_TABLET | Freq: Four times a day (QID) | ORAL | 0 refills | Status: AC | PRN

## 2024-01-26 NOTE — ED Provider Notes (Signed)
 [facility] Provider Note   CSN: [id number] Arrival date & time: 01/26/24  1751     History  Chief Complaint  Patient presents with   Leg Pain   Leg Swelling    Teresa Hayes is a 58 y.o. female.  Patient a 58 year old female with past medical history of lymphedema sent in by her primary care physician's for rule out DVT in her right lower extremity.  Patient admits to pain in the right lower extremity medial region for the past several days.  Patient denies any falls or trauma.  Denies any redness or wounds.  Says she was started on Keflex for possible cellulitis 2 days ago.   The history is provided by the patient. No language interpreter was used.  Leg Pain Associated symptoms: no back pain and no fever        Home Medications Prior to Admission medications   Medication Sig Start Date End Date Taking? Authorizing Provider  exemestane (AROMASIN) 25 MG tablet Take 1 tablet (25 mg total) by mouth daily after breakfast. 07/26/20   Magrinat, Valentino Hue, MD  lisinopril (ZESTRIL) 20 MG tablet Take 1 tablet (20 mg total) by mouth daily. Take with Zestoretic for a total of 40mg  /12.5mg  09/12/19 12/11/19  Tobb, Kardie, DO  lisinopril (ZESTRIL) 20 MG tablet Take 20 mg by mouth daily.    [provider]  lisinopril-hydrochlorothiazide (ZESTORETIC) 20-12.5 MG tablet Take 1 tablet by mouth daily. Take with lisinopril 20 for total of 40mg /12.5 mg 09/12/19   Tobb, Kardie, DO      Allergies    Ultram [tramadol], Topiramate, and Shellfish allergy    Review of Systems   Review of Systems  Constitutional:  Negative for chills and fever.  HENT:  Negative for ear pain and sore throat.   Eyes:  Negative for pain and visual disturbance.  Respiratory:  Negative for cough and shortness of breath.   Cardiovascular:  Positive for leg swelling. Negative for chest pain and palpitations.  Gastrointestinal:  Negative for abdominal pain and  vomiting.  Genitourinary:  Negative for dysuria and hematuria.  Musculoskeletal:  Negative for arthralgias and back pain.  Skin:  Negative for color change and rash.  Neurological:  Negative for seizures and syncope.  All other systems reviewed and are negative.   Physical Exam Updated Vital Signs BP 134/75 (BP Location: Right Arm)   Pulse (!) 103   Temp 99.1 F (37.3 C)   Resp 18   Ht 5\' 6"  (1.676 m)   LMP 06/02/2015 (Exact Date)   SpO2 100%   BMI 58.43 kg/m  Physical Exam Vitals and nursing note reviewed.  Constitutional:      General: She is not in acute distress.    Appearance: She is well-developed.  HENT:     Head: Normocephalic and atraumatic.  Eyes:     Conjunctiva/sclera: Conjunctivae normal.  Cardiovascular:     Rate and Rhythm: Normal rate and regular rhythm.     Heart sounds: No murmur heard. Pulmonary:     Effort: Pulmonary effort is normal. No respiratory distress.     Breath sounds: Normal breath sounds.  Abdominal:     Palpations: Abdomen is soft.     Tenderness: There is no abdominal tenderness.  Musculoskeletal:        General: No swelling.     Cervical back: Neck supple.  Skin:    General: Skin is warm and dry.  Capillary Refill: Capillary refill takes less than 2 seconds.     Comments: Lymphedema bilateral lower extremities.  Tenderness palpation of the right lower extremity in the medial compartments.  No warmth, erythema, or wounds.  Neurological:     Mental Status: She is alert.  Psychiatric:        Mood and Affect: Mood normal.     ED Results / Procedures / Treatments   Labs (all labs ordered are listed, but only abnormal results are displayed) Labs Reviewed - No data to display  EKG None  Radiology US Venous Img Lower Unilateral Right Result Date: 01/26/2024 CLINICAL DATA:  Swelling and pain EXAM: Right LOWER EXTREMITY VENOUS DOPPLER ULTRASOUND TECHNIQUE: Joven Mom-scale sonography with graded compression, as well as color Doppler  and duplex ultrasound were performed to evaluate the lower extremity deep venous systems from the level of the common femoral vein and including the common femoral, femoral, profunda femoral, popliteal and calf veins including the posterior tibial, peroneal and gastrocnemius veins when visible. The superficial great saphenous vein was also interrogated. Spectral Doppler was utilized to evaluate flow at rest and with distal augmentation maneuvers in the common femoral, femoral and popliteal veins. COMPARISON:  None Available. FINDINGS: Contralateral Common Femoral Vein: Respiratory phasicity is normal and symmetric with the symptomatic side. No evidence of thrombus. Normal compressibility. Common Femoral Vein: No evidence of thrombus. Normal compressibility, respiratory phasicity and response to augmentation. Saphenofemoral Junction: No evidence of thrombus. Normal compressibility and flow on color Doppler imaging. Profunda Femoral Vein: No evidence of thrombus. Normal compressibility and flow on color Doppler imaging. Femoral Vein: No evidence of thrombus. Normal compressibility, respiratory phasicity and response to augmentation. Popliteal Vein: No evidence of thrombus. Normal compressibility, respiratory phasicity and response to augmentation. Calf Veins: Poorly visible due to significant edema Other Findings: Marked soft tissue edema within the lower leg. Prominent right groin lymph nodes IMPRESSION: No evidence for DVT to the popliteal level. Calf vessels could not be visualized secondary to marked edema Electronically Signed   By: Jasmine Pang M.D.   On: 01/26/2024 21:21    Procedures Procedures    Medications Ordered in ED Medications - No data to display  ED Course/ Medical Decision Making/ A&P                                 Medical Decision Making  58 year old female presenting for DVT rule out in right lower extremity.  Lymphedema bilateral lower extremities.  Tenderness palpation of the  right lower extremity in the medial compartments.  No warmth, erythema, or wounds.  Ultrasound demonstrates no DVT.  Lower extremities neurovascular intact with palpable dorsalis pedis pulses.  Although no cellulitis visualized on today's exam, patient recommended to continue antibiotics prescribed by previous provider due to physical exam changes that may have occurred after 48 hours of antibiotics.  Mended for continued follow-up with primary care team and or lymphedema clinic.  Patient in no distress and overall condition improved here in the ED. Detailed discussions were had with the patient regarding current findings, and need for close f/u with PCP or on call doctor. The patient has been instructed to return immediately if the symptoms worsen in any way for re-evaluation. Patient verbalized understanding and is in agreement with current care plan. All questions answered prior to discharge.         Final Clinical Impression(s) / ED Diagnoses Final diagnoses:  Lymphedema  Rx / DC Orders ED Discharge Orders     None         Franne Forts, DO 01/26/24 2130

## 2024-01-26 NOTE — ED Notes (Signed)
 ED Provider at bedside.

## 2024-01-26 NOTE — ED Triage Notes (Addendum)
 Pt POV c/o R leg swelling x 3-4 days.  Denies parathesia. Sent by PCP to rule out DVT.   Pt reports initially being treated for possible cellulitis, took 3/10 days of Keflex, no improvement in sx.

## 2024-02-05 ENCOUNTER — Other Ambulatory Visit: Payer: Self-pay | Admitting: Obstetrics and Gynecology

## 2024-02-09 LAB — SURGICAL PATHOLOGY
